# Patient Record
Sex: Female | Born: 1995 | Race: Black or African American | Hispanic: No | Marital: Single | Smoking: Former smoker
Health system: Southern US, Community
[De-identification: ages and names within clinical notes are randomized; demographics above are authoritative.]

## PROBLEM LIST (undated history)

## (undated) DIAGNOSIS — Z98891 History of uterine scar from previous surgery: Secondary | ICD-10-CM

## (undated) DIAGNOSIS — E669 Obesity, unspecified: Secondary | ICD-10-CM

## (undated) DIAGNOSIS — N390 Urinary tract infection, site not specified: Secondary | ICD-10-CM

## (undated) HISTORY — PX: CHOLECYSTECTOMY: SHX55

## (undated) HISTORY — PX: LAPAROSCOPIC GASTRIC RESTRICTIVE DUODENAL PROCEDURE (DUODENAL SWITCH): SHX6667

---

## 2014-06-09 ENCOUNTER — Ambulatory Visit (INDEPENDENT_AMBULATORY_CARE_PROVIDER_SITE_OTHER): Payer: BLUE CROSS/BLUE SHIELD | Admitting: Family Medicine

## 2014-06-09 VITALS — BP 118/78 | HR 72 | Temp 98.2°F | Resp 16 | Ht 71.0 in | Wt 311.8 lb

## 2014-06-09 DIAGNOSIS — Z2821 Immunization not carried out because of patient refusal: Secondary | ICD-10-CM

## 2014-06-09 DIAGNOSIS — Z0000 Encounter for general adult medical examination without abnormal findings: Secondary | ICD-10-CM | POA: Diagnosis not present

## 2014-06-09 NOTE — Progress Notes (Signed)
      Chief Complaint:  Chief Complaint  Patient presents with  . Annual Exam    school    HPI: Wendy Mcfarland is a 19 y.o. female who is here for a school CPE For a physical [facility]  Is up-to-date on all her vaccines. She does not have her vaccine records here with us. She has had Her TB placement somewhere and does not have TB. Declined any blood work today. Menarche, reg, every  30 days Sexually active, no STDs, has been tested G0        History reviewed. No pertinent past medical history. History reviewed. No pertinent past surgical history. History   Social History  . Marital Status: Single    Spouse Name: N/A  . Number of Children: N/A  . Years of Education: N/A   Social History Main Topics  . Smoking status: Never Smoker   . Smokeless tobacco: Not on file  . Alcohol Use: Not on file  . Drug Use: Not on file  . Sexual Activity: Not on file   Other Topics Concern  . None   Social History Narrative  . None   History reviewed. No pertinent family history. No Known Allergies Prior to Admission medications   Not on File     ROS: The patient denies fevers, chills, night sweats, unintentional weight loss, chest pain, palpitations, wheezing, dyspnea on exertion, nausea, vomiting, abdominal pain, dysuria, hematuria, melena, numbness, weakness, or tingling.   All other systems have been reviewed and were otherwise negative with the exception of those mentioned in the HPI and as above.    PHYSICAL EXAM: Filed Vitals:   06/09/14 1332  BP: 118/78  Pulse: 72  Temp: 98.2 F (36.8 C)  Resp: 16   Filed Vitals:   06/09/14 1332  Height: 5\' 11"  (1.803 m)  Weight: 311 lb 12.8 oz (141.432 kg)   Body mass index is 43.51 kg/(m^2).  General: Alert, no acute distress, tall morbidly female HEENT:  Normocephalic, atraumatic, oropharynx patent. EOMI, PERRLA, funduscopic exam normal, and panic membrane  normal Cardiovascular:  Regular rate and rhythm, no rubs murmurs or gallops.  No Carotid bruits, radial pulse intact. No pedal edema.  Respiratory: Clear to auscultation bilaterally.  No wheezes, rales, or rhonchi.  No cyanosis, no use of accessory musculature GI: No organomegaly, abdomen is soft and non-tender, positive bowel sounds.  No masses. Skin: No rashes. Neurologic: Facial musculature symmetric. Psychiatric: Patient is appropriate throughout our interaction. Lymphatic: No cervical lymphadenopathy Musculoskeletal: Gait intact. 5 out of 5 strength, 2 out of 2 DTRs, negative for scoliosis   LABS: No results found for this or any previous visit.   EKG/XRAY:   Primary [facility]   ASSESSMENT/PLAN: Encounter Diagnoses  Name Primary?  . Annual physical exam Yes  . Influenza vaccination declined    Pleasant 19 year old female who declines any blood work for her annual visit States she has  had all her vaccines. Except for influenza vaccine which she does not want. Follow-up as needed.  Gross sideeffects, risk and benefits, and alternatives of medications d/w patient. Patient is aware that all medications have potential sideeffects and we are unable to predict every sideeffect or drug-drug interaction that may occur.  Hamilton CapriLE, Zitlaly Malson PHUONG, DO 06/09/2014 2:29 PM

## 2014-06-11 ENCOUNTER — Encounter: Payer: Self-pay | Admitting: Family Medicine

## 2017-11-16 LAB — OB RESULTS CONSOLE RUBELLA ANTIBODY, IGM: Rubella: IMMUNE

## 2017-11-16 LAB — OB RESULTS CONSOLE ABO/RH: RH Type: POSITIVE

## 2017-11-16 LAB — OB RESULTS CONSOLE GC/CHLAMYDIA
Chlamydia: NEGATIVE
Gonorrhea: NEGATIVE

## 2017-11-16 LAB — OB RESULTS CONSOLE HEPATITIS B SURFACE ANTIGEN: Hepatitis B Surface Ag: NEGATIVE

## 2017-11-16 LAB — OB RESULTS CONSOLE HIV ANTIBODY (ROUTINE TESTING): HIV: NONREACTIVE

## 2017-11-30 ENCOUNTER — Encounter (HOSPITAL_COMMUNITY): Payer: Self-pay

## 2017-12-31 ENCOUNTER — Ambulatory Visit: Payer: BLUE CROSS/BLUE SHIELD | Admitting: Podiatry

## 2018-01-16 ENCOUNTER — Encounter (HOSPITAL_COMMUNITY): Payer: Self-pay

## 2018-02-06 ENCOUNTER — Other Ambulatory Visit (HOSPITAL_COMMUNITY): Payer: Self-pay | Admitting: Obstetrics and Gynecology

## 2018-02-06 DIAGNOSIS — Z363 Encounter for antenatal screening for malformations: Secondary | ICD-10-CM

## 2018-02-13 ENCOUNTER — Encounter (HOSPITAL_COMMUNITY): Payer: Self-pay

## 2018-02-15 ENCOUNTER — Encounter (HOSPITAL_COMMUNITY): Payer: Self-pay | Admitting: *Deleted

## 2018-02-21 ENCOUNTER — Ambulatory Visit (HOSPITAL_COMMUNITY)
Admission: RE | Admit: 2018-02-21 | Discharge: 2018-02-21 | Disposition: A | Discharge status: Home / Self Care | Payer: BLUE CROSS/BLUE SHIELD | Source: Ambulatory Visit | Attending: Obstetrics and Gynecology | Admitting: Obstetrics and Gynecology

## 2018-02-21 ENCOUNTER — Encounter (HOSPITAL_COMMUNITY): Payer: Self-pay

## 2018-02-21 DIAGNOSIS — O30042 Twin pregnancy, dichorionic/diamniotic, second trimester: Secondary | ICD-10-CM | POA: Diagnosis present

## 2018-02-21 DIAGNOSIS — Z363 Encounter for antenatal screening for malformations: Secondary | ICD-10-CM

## 2018-02-21 DIAGNOSIS — E669 Obesity, unspecified: Secondary | ICD-10-CM | POA: Diagnosis not present

## 2018-02-21 DIAGNOSIS — Z3A24 24 weeks gestation of pregnancy: Secondary | ICD-10-CM | POA: Insufficient documentation

## 2018-02-21 DIAGNOSIS — O99212 Obesity complicating pregnancy, second trimester: Secondary | ICD-10-CM | POA: Diagnosis not present

## 2018-02-21 DIAGNOSIS — O283 Abnormal ultrasonic finding on antenatal screening of mother: Secondary | ICD-10-CM | POA: Insufficient documentation

## 2018-02-21 HISTORY — DX: Obesity, unspecified: E66.9

## 2018-02-21 HISTORY — DX: Urinary tract infection, site not specified: N39.0

## 2018-03-29 ENCOUNTER — Other Ambulatory Visit: Payer: Self-pay

## 2018-03-29 ENCOUNTER — Inpatient Hospital Stay (HOSPITAL_BASED_OUTPATIENT_CLINIC_OR_DEPARTMENT_OTHER): Payer: BLUE CROSS/BLUE SHIELD

## 2018-03-29 ENCOUNTER — Inpatient Hospital Stay (HOSPITAL_COMMUNITY)
Admission: AD | Admit: 2018-03-29 | Discharge: 2018-04-02 | DRG: 786 | Disposition: A | Discharge status: Home / Self Care | Payer: BLUE CROSS/BLUE SHIELD | Attending: Obstetrics and Gynecology | Admitting: Obstetrics and Gynecology

## 2018-03-29 ENCOUNTER — Encounter (HOSPITAL_COMMUNITY): Payer: Self-pay | Admitting: *Deleted

## 2018-03-29 DIAGNOSIS — O42913 Preterm premature rupture of membranes, unspecified as to length of time between rupture and onset of labor, third trimester: Principal | ICD-10-CM | POA: Diagnosis present

## 2018-03-29 DIAGNOSIS — O99212 Obesity complicating pregnancy, second trimester: Secondary | ICD-10-CM

## 2018-03-29 DIAGNOSIS — O42912 Preterm premature rupture of membranes, unspecified as to length of time between rupture and onset of labor, second trimester: Secondary | ICD-10-CM | POA: Diagnosis not present

## 2018-03-29 DIAGNOSIS — O30042 Twin pregnancy, dichorionic/diamniotic, second trimester: Secondary | ICD-10-CM | POA: Diagnosis not present

## 2018-03-29 DIAGNOSIS — O4292 Full-term premature rupture of membranes, unspecified as to length of time between rupture and onset of labor: Secondary | ICD-10-CM | POA: Diagnosis not present

## 2018-03-29 DIAGNOSIS — O42919 Preterm premature rupture of membranes, unspecified as to length of time between rupture and onset of labor, unspecified trimester: Secondary | ICD-10-CM | POA: Diagnosis present

## 2018-03-29 DIAGNOSIS — Z87891 Personal history of nicotine dependence: Secondary | ICD-10-CM | POA: Diagnosis not present

## 2018-03-29 DIAGNOSIS — Z362 Encounter for other antenatal screening follow-up: Secondary | ICD-10-CM

## 2018-03-29 DIAGNOSIS — O9902 Anemia complicating childbirth: Secondary | ICD-10-CM | POA: Diagnosis present

## 2018-03-29 DIAGNOSIS — O328XX1 Maternal care for other malpresentation of fetus, fetus 1: Secondary | ICD-10-CM | POA: Diagnosis present

## 2018-03-29 DIAGNOSIS — Z3A29 29 weeks gestation of pregnancy: Secondary | ICD-10-CM

## 2018-03-29 DIAGNOSIS — O30043 Twin pregnancy, dichorionic/diamniotic, third trimester: Secondary | ICD-10-CM | POA: Diagnosis present

## 2018-03-29 DIAGNOSIS — D649 Anemia, unspecified: Secondary | ICD-10-CM | POA: Diagnosis present

## 2018-03-29 DIAGNOSIS — Z98891 History of uterine scar from previous surgery: Secondary | ICD-10-CM

## 2018-03-29 HISTORY — DX: History of uterine scar from previous surgery: Z98.891

## 2018-03-29 LAB — TYPE AND SCREEN
ABO/RH(D): A POS
Antibody Screen: NEGATIVE

## 2018-03-29 LAB — URINALYSIS, ROUTINE W REFLEX MICROSCOPIC
Bacteria, UA: NONE SEEN
Bilirubin Urine: NEGATIVE
Glucose, UA: NEGATIVE mg/dL
Hgb urine dipstick: NEGATIVE
Ketones, ur: NEGATIVE mg/dL
Nitrite: NEGATIVE
Protein, ur: NEGATIVE mg/dL
Specific Gravity, Urine: 1.023 (ref 1.005–1.030)
pH: 5 (ref 5.0–8.0)

## 2018-03-29 LAB — AMNISURE RUPTURE OF MEMBRANE (ROM) NOT AT ARMC: Amnisure ROM: POSITIVE

## 2018-03-29 LAB — POCT FERN TEST: POCT Fern Test: POSITIVE

## 2018-03-29 LAB — ABO/RH: ABO/RH(D): A POS

## 2018-03-29 MED ORDER — SODIUM CHLORIDE 0.9 % IV SOLN
500.0000 mg | INTRAVENOUS | Status: AC
  Administered 2018-03-29 – 2018-03-30 (×2): 500 mg via INTRAVENOUS
  Filled 2018-03-29 (×2): qty 500

## 2018-03-29 MED ORDER — ZOLPIDEM TARTRATE 5 MG PO TABS
5.0000 mg | ORAL_TABLET | Freq: Every evening | ORAL | Status: DC | PRN
  Administered 2018-03-29: 5 mg via ORAL
  Filled 2018-03-29: qty 1

## 2018-03-29 MED ORDER — CLINDAMYCIN PHOSPHATE 900 MG/50ML IV SOLN
900.0000 mg | Freq: Three times a day (TID) | INTRAVENOUS | Status: DC
  Filled 2018-03-29 (×2): qty 50

## 2018-03-29 MED ORDER — BETAMETHASONE SOD PHOS & ACET 6 (3-3) MG/ML IJ SUSP
12.0000 mg | INTRAMUSCULAR | Status: AC
  Administered 2018-03-29 – 2018-03-30 (×2): 12 mg via INTRAMUSCULAR
  Filled 2018-03-29 (×2): qty 2

## 2018-03-29 MED ORDER — ACETAMINOPHEN 325 MG PO TABS
650.0000 mg | ORAL_TABLET | ORAL | Status: DC | PRN
  Administered 2018-03-30: 650 mg via ORAL
  Filled 2018-03-29: qty 2

## 2018-03-29 MED ORDER — CALCIUM CARBONATE ANTACID 500 MG PO CHEW: 2.0000 | CHEWABLE_TABLET | ORAL | Status: DC | PRN

## 2018-03-29 MED ORDER — DOCUSATE SODIUM 100 MG PO CAPS
100.0000 mg | ORAL_CAPSULE | Freq: Every day | ORAL | Status: DC
  Filled 2018-03-29: qty 1

## 2018-03-29 MED ORDER — AZITHROMYCIN 250 MG PO TABS: 500.0000 mg | ORAL_TABLET | Freq: Every day | ORAL | Status: DC

## 2018-03-29 MED ORDER — CEFAZOLIN SODIUM-DEXTROSE 2-4 GM/100ML-% IV SOLN
2.0000 g | Freq: Once | INTRAVENOUS | Status: AC
  Administered 2018-03-29: 2 g via INTRAVENOUS
  Filled 2018-03-29: qty 100

## 2018-03-29 MED ORDER — PRENATAL MULTIVITAMIN CH: 1.0000 | ORAL_TABLET | Freq: Every day | ORAL | Status: DC

## 2018-03-29 MED ORDER — CEFAZOLIN SODIUM-DEXTROSE 1-4 GM/50ML-% IV SOLN
1.0000 g | Freq: Four times a day (QID) | INTRAVENOUS | Status: DC
  Administered 2018-03-29 – 2018-03-30 (×4): 1 g via INTRAVENOUS
  Filled 2018-03-29 (×5): qty 50

## 2018-03-29 MED ORDER — LACTATED RINGERS IV SOLN
INTRAVENOUS | Status: DC
  Administered 2018-03-30: 01:00:00 via INTRAVENOUS

## 2018-03-29 NOTE — Progress Notes (Signed)
Pharmacy Antibiotic Note  Wendy Mcfarland is a 23 y.o. G1P0 at [redacted]w[redacted]d admitted on 03/29/2018 with PPROM. Pt has a history of penicillin allergy. Pharmacy has been consulted for cefazolin dosing.  Plan: Cefazolin 2 Gm IV x 1 dose; then 1 Gm IV every 6 hours for 7 days  Height: 5\' 11"  (180.3 cm) Weight: 275 lb 2.2 oz (124.8 kg) IBW/kg (Calculated) : 70.8  Temp (24hrs), Avg:98.7 F (37.1 C), Min:98.6 F (37 C), Max:98.8 F (37.1 C)  No results for input(s): WBC, CREATININE, LATICACIDVEN, VANCOTROUGH, VANCOPEAK, VANCORANDOM, GENTTROUGH, GENTPEAK, GENTRANDOM, TOBRATROUGH, TOBRAPEAK, TOBRARND, AMIKACINPEAK, AMIKACINTROU, AMIKACIN in the last 168 hours.  CrCl cannot be calculated (No successful lab value found.).    Allergies  Allergen Reactions  . Penicillins Hives    Antimicrobials this admission: Azithromycin 03/29/17>> for 7 days  Dose adjustments this admission: N/A  Microbiology results:  Thank you for allowing pharmacy to be a part of this patient's care.  Arelia Sneddon 03/29/2018 7:03 AM

## 2018-03-29 NOTE — MAU Provider Note (Signed)
History     CSN: [id number]  Arrival date and time: 03/29/18 [id number]   First Provider Initiated Contact with Patient 03/29/18 [phone]      Chief Complaint  Patient presents with  . Rupture of Membranes   Wendy Mcfarland is a 23 y.o. G1P0 at 6973w6d who presents today with leaking fluid. She states that at 0230 she woke up in a puddle of fluid, and she has been leaking since. She denies any VB or contractions. She reports normal fetal movement. She denies any complications with this pregnancy. She denies any hx of CHTN. At her first OB visit her B/P was 138/90. She states that was when she found out she was having twins, and she believes it was elevated because of that. Next OB visit: 04/05/18  Vaginal Discharge  The patient's primary symptoms include vaginal discharge. The patient's pertinent negatives include no pelvic pain. This is a new problem. The current episode started today. The problem occurs constantly. The problem has been unchanged. The patient is experiencing no pain. She is pregnant. Pertinent negatives include no chills, dysuria, fever, frequency, nausea, urgency or vomiting. The vaginal discharge was watery. There has been no bleeding. Nothing aggravates the symptoms. She has tried nothing for the symptoms. Sexual activity: denies any intercourse in the last 24 hours.     OB History    Gravida  1   Para      Term      Preterm      AB      Living  0     SAB      TAB      Ectopic      Multiple      Live Births              Past Medical History:  Diagnosis Date  . Obesity   . Urinary tract infection     Past Surgical History:  Procedure Laterality Date  . 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  Social History   Tobacco Use  . Smoking status: Former Smoker    Types: Cigarettes    Last attempt to quit: 07/22/2017    Years since quitting: 0.6  . Smokeless tobacco: Never Used  Substance Use  Topics  . Alcohol use: Not Currently    Alcohol/week: 0.0 standard drinks  . Drug use: Never    Allergies:  Allergies  Allergen Reactions  . Penicillins Hives    Medications Prior to Admission  Medication Sig Dispense Refill Last Dose  . diphenhydrAMINE HCl (BENADRYL PO) Take by mouth.   Past Month at Unknown time  . Prenatal Multivit-Min-Fe-FA (PRENATAL VITAMINS PO) Take by mouth.   03/28/2018 at Unknown time    Review of Systems  Constitutional: Negative for chills and fever.  Gastrointestinal: Negative for nausea and vomiting.  Genitourinary: Positive for vaginal discharge. Negative for dysuria, frequency, pelvic pain and urgency.   Physical Exam   Blood pressure 128/80, pulse 76, temperature 98.6 F (37 C), temperature source Oral, resp. rate 19, height 5\' 11"  (1.803 m), weight 124.8 kg, last menstrual period 09/05/2017.  Physical Exam  Nursing note and vitals reviewed. Constitutional: She is oriented to person, place, and time. She appears well-developed and well-nourished. No distress.  HENT:  Head: Normocephalic.  Cardiovascular: Normal rate.  Respiratory: Effort normal.  GI: Soft. There is no abdominal tenderness. There is no rebound.  Genitourinary:    Genitourinary Comments: Patient  grossly ruptured with clear fluid covering perineum.  Full pelvic exam deferred due to patient not feeling any contractions and fluid visible on the perineum.    Neurological: She is alert and oriented to person, place, and time.  Skin: Skin is warm and dry.  Psychiatric: She has a normal mood and affect.     NST: A Baseline: 140 Variability: moderate Accels: 10x10 Decels: none Toco: irregular  NST: B Baseline: 145 Variability: moderate Accels: 10x10 Decels: none Toco: irregular  Results for orders placed or performed during the hospital encounter of 03/29/18 (from the past 24 hour(s))  Amnisure rupture of membrane (rom)not at [facility]     Status: None   Collection Time:  03/29/18  4:40 AM  Result Value Ref Range   Amnisure ROM POSITIVE   Urinalysis, Routine w reflex microscopic     Status: Abnormal   Collection Time: 03/29/18  4:51 AM  Result Value Ref Range   Color, Urine YELLOW YELLOW   APPearance HAZY (A) CLEAR   Specific Gravity, Urine 1.023 1.005 - 1.030   pH 5.0 5.0 - 8.0   Glucose, UA NEGATIVE NEGATIVE mg/dL   Hgb urine dipstick NEGATIVE NEGATIVE   Bilirubin Urine NEGATIVE NEGATIVE   Ketones, ur NEGATIVE NEGATIVE mg/dL   Protein, ur NEGATIVE NEGATIVE mg/dL   Nitrite NEGATIVE NEGATIVE   Leukocytes, UA TRACE (A) NEGATIVE   RBC / HPF 0-5 0 - 5 RBC/hpf   WBC, UA 11-20 0 - 5 WBC/hpf   Bacteria, UA NONE SEEN NONE SEEN   Squamous Epithelial / LPF 6-10 0 - 5   Mucus PRESENT    Ca Oxalate Crys, UA PRESENT   Fern Test     Status: Abnormal   Collection Time: 03/29/18  4:53 AM  Result Value Ref Range   POCT Fern Test Positive = ruptured amniotic membanes      MAU Course  Procedures  MDM  5:12 AM Consult with Dr. Jackelyn Knife, will admit to 3rd floor. Latency abx, BMZ, NICU consult and MFM FU [place] ordered.   Patient reports developing hives when taking PCN on 2 different occasions as a child. No airway edema or airway involvement.   Assessment and Plan   1. Preterm premature rupture of membranes (PPROM) with unknown onset of labor    Admit to 3rd floor Routine orders  Thressa Sheller DNP, CNM  03/29/18  5:11 AM

## 2018-03-29 NOTE — Consult Note (Signed)
Neonatology Consult to Antenatal Patient:  I was asked by Dr. Hinton Rao to see this patient in order to provide antenatal counseling due to SROM at 29 6/7 weeks.  Wendy Mcfarland was admitted today at 62 6/[redacted] weeks GA. Pregnancy has been complicated by di-di concordant twins. She had gross ROM early this morning. She is currently not having active labor. She is getting BMZ and IV Azithromycin and Ancef. These are her first babies, 2 boys.  I spoke with the patient and the father of the babies. We discussed the worst case of delivery in the next 1-2 days, including usual DR management, possible respiratory complications and need for support, IV access, feedings (mother desires breast feeding, which was encouraged), LOS, Mortality and Morbidity, and long term outcomes. She did not have any questions at this time. I offered a NICU tour to any interested family members and would be glad to come back if she has more questions later.  Thank you for asking me to see this patient.  Doretha Sou, MD Neonatologist  The total length of face-to-face or floor/unit time for this encounter was 25 minutes. Counseling and/or coordination of care was 15 minutes of the above.

## 2018-03-29 NOTE — MAU Note (Signed)
Pt presents to MAU with possible PROM @0230  clear fluid. Pt reports one small blood clot and +FM.

## 2018-03-29 NOTE — H&P (Signed)
Wendy Mcfarland is a 23 y.o. female G1 at 29+6 with PPROM, confirmed in MAU.  Di/Di twin gestation.  ROM at 2-3am, copious clear fluid.  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  Glucola WNL Varicella nonimmune.  OB History    Gravida  1   Para      Term      Preterm      AB      Living  0     SAB      TAB      Ectopic      Multiple      Live Births            G1 present  No abn pap No STD  Past Medical History:  Diagnosis Date  . Obesity   . Urinary tract infection    Past Surgical History:  Procedure Laterality Date  . 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 Social History:  reports that she quit smoking about 8 months ago. Her smoking use included cigarettes. She has never used smokeless tobacco. She reports previous alcohol use. She reports that she does not use drugs.works in childcare, single  Meds PNV All NKDA     Maternal Diabetes: No Genetic Screening: Normal Maternal Ultrasounds/Referrals: Abnormal:  Findings:   Isolated EIF (echogenic intracardiac focus) Fetal Ultrasounds or other Referrals:  Referred to Materal Fetal Medicine  Maternal Substance Abuse:  No Significant Maternal Medications:  None Significant Maternal Lab Results:  None Other Comments:  None  Review of Systems  Constitutional: Negative.   HENT: Negative.   Eyes: Negative.   Respiratory: Negative.   Cardiovascular: Negative.   Gastrointestinal: Negative.   Genitourinary: Negative.   Musculoskeletal: Negative.   Skin: Negative.   Neurological: Negative.   Psychiatric/Behavioral: Negative.    Maternal Medical History:  Reason for admission: Rupture of membranes.   Contractions: Frequency: irregular.    Fetal activity: Perceived fetal activity is normal.    Prenatal complications: Twin pregnancy, PPROM  Prenatal  Complications - Diabetes: none.      Blood pressure 122/83, pulse 79, temperature 98.9 F (37.2 C), temperature source Oral, resp. rate 18, height 5\' 11"  (1.803 m), weight 124.8 kg, last menstrual period 09/05/2017, SpO2 99 %. Maternal Exam:  Uterine Assessment: Contraction strength is moderate.  Contraction frequency is irregular.   Abdomen: Patient reports no abdominal tenderness. Fundal height is appropriate for gestation.   Estimated fetal weight is 2-3#, 2-3#.    Introitus: Normal vulva. Normal vagina.    Physical Exam  Constitutional: She is oriented to person, place, and time. She appears well-developed and well-nourished.  HENT:  Head: Normocephalic and atraumatic.  Cardiovascular: Normal rate and regular rhythm.  Respiratory: Effort normal and breath sounds normal. No respiratory distress. She has no wheezes.  GI: Soft. Bowel sounds are normal. She exhibits no distension. There is no abdominal tenderness.  Genitourinary:    Vulva normal.   Musculoskeletal: Normal range of motion.  Neurological: She is alert and oriented to person, place, and time.  Skin: Skin is warm and dry.  Psychiatric: She has a normal mood and affect. Her behavior is normal.    Prenatal labs: ABO, Rh: --/--/A POS (01/08 0440) Antibody: NEG (01/08 0440) Rubella:  immune RPR:   NR HBsAg:   neg HIV:   neg GBS:   unk  Hgb 11.9/Plt 186/Ur Cx + ecoli -  TOC neg/GC neg/Chl neg/Varicella nonimmune Hgb electro WNL WNL/ glucola 83  Essential panel - neg  Nl anat, di/di, ant plac x 2 - EIF  MFM F/u WNL MFM  Assessment/Plan: PPROM at 29+6  Offered Flu, Tdap - declined Will collect GC/Chl/GBBS Varicella vaccine PP Ancef/Zithro for latency BMZ for fetal lung maturity  Close monitoring [place] - breech presentation twin A - d/w pt delivery by LTCS - d/w pt r/b/a.  Questions answered.  If labors will proceed,    Wendy Mcfarland 03/29/2018, 1:09 PM

## 2018-03-29 NOTE — Progress Notes (Signed)
Patient ID: Wendy Mcfarland, female   DOB: 10-Jan-1996, 23 y.o.   MRN: [id number]\\  29+6, di/di twins, PPRIOM  +FM, cont LOF, clear; no VB, no ctx  AFVSS gen NAD FHTs - 130-140's, mod var, 10x10 accels - app for gestation, category 1 140's, mod var, 10X10 accels, app fpr gestation; category 1 toco none  Abd gravid, NT  Abx for latency BMZ for lung maturity NICU c/s - MD called MFM [place] today

## 2018-03-30 ENCOUNTER — Encounter (HOSPITAL_COMMUNITY)
Admission: AD | Disposition: A | Discharge status: Home / Self Care | Payer: Self-pay | Source: Home / Self Care | Attending: Obstetrics and Gynecology

## 2018-03-30 ENCOUNTER — Encounter (HOSPITAL_COMMUNITY): Payer: Self-pay | Admitting: Obstetrics and Gynecology

## 2018-03-30 ENCOUNTER — Inpatient Hospital Stay (HOSPITAL_COMMUNITY): Payer: BLUE CROSS/BLUE SHIELD | Admitting: Anesthesiology

## 2018-03-30 DIAGNOSIS — Z98891 History of uterine scar from previous surgery: Secondary | ICD-10-CM

## 2018-03-30 HISTORY — DX: History of uterine scar from previous surgery: Z98.891

## 2018-03-30 LAB — COMPREHENSIVE METABOLIC PANEL
ALT: 13 U/L (ref 0–44)
AST: 15 U/L (ref 15–41)
Albumin: 2.7 g/dL — ABNORMAL LOW (ref 3.5–5.0)
Alkaline Phosphatase: 70 U/L (ref 38–126)
Anion gap: 9 (ref 5–15)
BUN: 9 mg/dL (ref 6–20)
CO2: 17 mmol/L — ABNORMAL LOW (ref 22–32)
Calcium: 8.4 mg/dL — ABNORMAL LOW (ref 8.9–10.3)
Chloride: 108 mmol/L (ref 98–111)
Creatinine, Ser: 0.54 mg/dL (ref 0.44–1.00)
GFR calc Af Amer: >60 mL/min (ref >60)
GFR calc non Af Amer: >60 mL/min (ref >60)
Glucose, Bld: 119 mg/dL — ABNORMAL HIGH (ref 70–99)
Potassium: 3.2 mmol/L — ABNORMAL LOW (ref 3.5–5.1)
Sodium: 134 mmol/L — ABNORMAL LOW (ref 135–145)
Total Bilirubin: 0.5 mg/dL (ref 0.3–1.2)
Total Protein: 5.8 g/dL — ABNORMAL LOW (ref 6.5–8.1)

## 2018-03-30 LAB — CBC
HCT: 28 % — ABNORMAL LOW (ref 36.0–46.0)
Hemoglobin: 9.4 g/dL — ABNORMAL LOW (ref 12.0–15.0)
MCH: 29.3 pg (ref 26.0–34.0)
MCHC: 33.6 g/dL (ref 30.0–36.0)
MCV: 87.2 fL (ref 80.0–100.0)
Platelets: 143 10*3/uL — ABNORMAL LOW (ref 150–400)
RBC: 3.21 MIL/uL — ABNORMAL LOW (ref 3.87–5.11)
RDW: 12.9 % (ref 11.5–15.5)
WBC: 10 10*3/uL (ref 4.0–10.5)
nRBC: 0 % (ref 0.0–0.2)

## 2018-03-30 LAB — GC/CHLAMYDIA PROBE AMP (~~LOC~~) NOT AT ARMC
Chlamydia: NEGATIVE
Neisseria Gonorrhea: NEGATIVE

## 2018-03-30 SURGERY — Surgical Case
Anesthesia: General

## 2018-03-30 MED ORDER — LIDOCAINE HCL (CARDIAC) PF 100 MG/5ML IV SOSY
PREFILLED_SYRINGE | INTRAVENOUS | Status: DC | PRN
  Administered 2018-03-30: 100 mg via INTRAVENOUS

## 2018-03-30 MED ORDER — ONDANSETRON HCL 4 MG/2ML IJ SOLN: 4.0000 mg | Freq: Four times a day (QID) | INTRAMUSCULAR | Status: DC | PRN

## 2018-03-30 MED ORDER — DIPHENHYDRAMINE HCL 50 MG/ML IJ SOLN: 12.5000 mg | Freq: Four times a day (QID) | INTRAMUSCULAR | Status: DC | PRN

## 2018-03-30 MED ORDER — SUCCINYLCHOLINE CHLORIDE 20 MG/ML IJ SOLN
INTRAMUSCULAR | Status: DC | PRN
  Administered 2018-03-30: 100 mg via INTRAVENOUS

## 2018-03-30 MED ORDER — FENTANYL CITRATE (PF) 250 MCG/5ML IJ SOLN
INTRAMUSCULAR | Status: AC
  Filled 2018-03-30: qty 5

## 2018-03-30 MED ORDER — COCONUT OIL OIL: 1.0000 "application " | TOPICAL_OIL | Status: DC | PRN

## 2018-03-30 MED ORDER — DIPHENHYDRAMINE HCL 12.5 MG/5ML PO ELIX
12.5000 mg | ORAL_SOLUTION | Freq: Four times a day (QID) | ORAL | Status: DC | PRN
  Filled 2018-03-30: qty 5

## 2018-03-30 MED ORDER — SODIUM CHLORIDE 0.9% FLUSH: 3.0000 mL | INTRAVENOUS | Status: DC | PRN

## 2018-03-30 MED ORDER — FENTANYL CITRATE (PF) 100 MCG/2ML IJ SOLN
INTRAMUSCULAR | Status: AC
  Filled 2018-03-30: qty 2

## 2018-03-30 MED ORDER — WITCH HAZEL-GLYCERIN EX PADS: 1.0000 "application " | MEDICATED_PAD | CUTANEOUS | Status: DC | PRN

## 2018-03-30 MED ORDER — FENTANYL CITRATE (PF) 100 MCG/2ML IJ SOLN
INTRAMUSCULAR | Status: DC | PRN
  Administered 2018-03-30 (×2): 50 ug via INTRAVENOUS
  Administered 2018-03-30: 250 ug via INTRAVENOUS
  Administered 2018-03-30: 100 ug via INTRAVENOUS

## 2018-03-30 MED ORDER — EPHEDRINE 5 MG/ML INJ
INTRAVENOUS | Status: AC
  Filled 2018-03-30: qty 10

## 2018-03-30 MED ORDER — LACTATED RINGERS IV SOLN
INTRAVENOUS | Status: DC
  Administered 2018-03-30: 12:00:00 via INTRAVENOUS

## 2018-03-30 MED ORDER — SUGAMMADEX SODIUM 500 MG/5ML IV SOLN
INTRAVENOUS | Status: AC
  Filled 2018-03-30: qty 5

## 2018-03-30 MED ORDER — TETANUS-DIPHTH-ACELL PERTUSSIS 5-2.5-18.5 LF-MCG/0.5 IM SUSP: 0.5000 mL | Freq: Once | INTRAMUSCULAR | Status: DC

## 2018-03-30 MED ORDER — KETOROLAC TROMETHAMINE 30 MG/ML IJ SOLN
INTRAMUSCULAR | Status: AC
  Filled 2018-03-30: qty 1

## 2018-03-30 MED ORDER — MENTHOL 3 MG MT LOZG: 1.0000 | LOZENGE | OROMUCOSAL | Status: DC | PRN

## 2018-03-30 MED ORDER — OXYTOCIN 40 UNITS IN NORMAL SALINE INFUSION - SIMPLE MED: 2.5000 [IU]/h | INTRAVENOUS | Status: AC

## 2018-03-30 MED ORDER — OXYTOCIN 10 UNIT/ML IJ SOLN
INTRAVENOUS | Status: DC | PRN
  Administered 2018-03-30: 40 [IU] via INTRAVENOUS

## 2018-03-30 MED ORDER — ONDANSETRON HCL 4 MG/2ML IJ SOLN
INTRAMUSCULAR | Status: DC | PRN
  Administered 2018-03-30: 4 mg via INTRAVENOUS

## 2018-03-30 MED ORDER — DIPHENHYDRAMINE HCL 12.5 MG/5ML PO ELIX: 12.5000 mg | ORAL_SOLUTION | Freq: Four times a day (QID) | ORAL | Status: DC | PRN

## 2018-03-30 MED ORDER — NALBUPHINE HCL 10 MG/ML IJ SOLN: 5.0000 mg | Freq: Once | INTRAMUSCULAR | Status: DC | PRN

## 2018-03-30 MED ORDER — LACTATED RINGERS IV SOLN
INTRAVENOUS | Status: DC | PRN
  Administered 2018-03-30: 08:00:00 via INTRAVENOUS

## 2018-03-30 MED ORDER — NALOXONE HCL 4 MG/10ML IJ SOLN
1.0000 ug/kg/h | INTRAVENOUS | Status: DC | PRN
  Filled 2018-03-30: qty 5

## 2018-03-30 MED ORDER — SENNOSIDES-DOCUSATE SODIUM 8.6-50 MG PO TABS
2.0000 | ORAL_TABLET | ORAL | Status: DC
  Administered 2018-03-30 – 2018-03-31 (×2): 2 via ORAL
  Filled 2018-03-30 (×2): qty 2

## 2018-03-30 MED ORDER — NALBUPHINE HCL 10 MG/ML IJ SOLN: 5.0000 mg | INTRAMUSCULAR | Status: DC | PRN

## 2018-03-30 MED ORDER — SODIUM CHLORIDE 0.9% FLUSH: 9.0000 mL | INTRAVENOUS | Status: DC | PRN

## 2018-03-30 MED ORDER — MIDAZOLAM HCL 2 MG/2ML IJ SOLN
INTRAMUSCULAR | Status: AC
  Filled 2018-03-30: qty 2

## 2018-03-30 MED ORDER — MEPERIDINE HCL 25 MG/ML IJ SOLN: 6.2500 mg | INTRAMUSCULAR | Status: DC | PRN

## 2018-03-30 MED ORDER — ZOLPIDEM TARTRATE 5 MG PO TABS: 5.0000 mg | ORAL_TABLET | Freq: Every evening | ORAL | Status: DC | PRN

## 2018-03-30 MED ORDER — EPHEDRINE SULFATE 50 MG/ML IJ SOLN
INTRAMUSCULAR | Status: DC | PRN
  Administered 2018-03-30: 10 mg via INTRAVENOUS

## 2018-03-30 MED ORDER — MIDAZOLAM HCL 2 MG/2ML IJ SOLN
INTRAMUSCULAR | Status: DC | PRN
  Administered 2018-03-30: 2 mg via INTRAVENOUS

## 2018-03-30 MED ORDER — PROMETHAZINE HCL 25 MG/ML IJ SOLN: 6.2500 mg | INTRAMUSCULAR | Status: DC | PRN

## 2018-03-30 MED ORDER — HYDROMORPHONE 1 MG/ML IV SOLN
INTRAVENOUS | Status: DC
  Administered 2018-03-30: 30 mg via INTRAVENOUS
  Administered 2018-03-30: 0.6 mg via INTRAVENOUS
  Administered 2018-03-30: 1.4 mg via INTRAVENOUS
  Administered 2018-03-30: 0.6 mg via INTRAVENOUS
  Filled 2018-03-30: qty 25

## 2018-03-30 MED ORDER — PHENYLEPHRINE 40 MCG/ML (10ML) SYRINGE FOR IV PUSH (FOR BLOOD PRESSURE SUPPORT)
PREFILLED_SYRINGE | INTRAVENOUS | Status: AC
  Filled 2018-03-30: qty 10

## 2018-03-30 MED ORDER — PROPOFOL 10 MG/ML IV BOLUS
INTRAVENOUS | Status: AC
  Filled 2018-03-30: qty 20

## 2018-03-30 MED ORDER — SIMETHICONE 80 MG PO CHEW
80.0000 mg | CHEWABLE_TABLET | Freq: Three times a day (TID) | ORAL | Status: DC
  Administered 2018-03-31 – 2018-04-02 (×7): 80 mg via ORAL
  Filled 2018-03-30 (×7): qty 1

## 2018-03-30 MED ORDER — NALOXONE HCL 0.4 MG/ML IJ SOLN: 0.4000 mg | INTRAMUSCULAR | Status: DC | PRN

## 2018-03-30 MED ORDER — HYDROMORPHONE HCL 1 MG/ML IJ SOLN
INTRAMUSCULAR | Status: AC
  Administered 2018-03-30: 0.5 mg
  Filled 2018-03-30: qty 0.5

## 2018-03-30 MED ORDER — DIPHENHYDRAMINE HCL 25 MG PO CAPS
25.0000 mg | ORAL_CAPSULE | ORAL | Status: DC | PRN
  Filled 2018-03-30: qty 1

## 2018-03-30 MED ORDER — DIBUCAINE 1 % RE OINT: 1.0000 "application " | TOPICAL_OINTMENT | RECTAL | Status: DC | PRN

## 2018-03-30 MED ORDER — IBUPROFEN 800 MG PO TABS
800.0000 mg | ORAL_TABLET | Freq: Three times a day (TID) | ORAL | Status: DC
  Filled 2018-03-30: qty 1

## 2018-03-30 MED ORDER — GLYCOPYRROLATE 0.2 MG/ML IJ SOLN
INTRAMUSCULAR | Status: DC | PRN
  Administered 2018-03-30: 0.2 mg via INTRAVENOUS

## 2018-03-30 MED ORDER — SIMETHICONE 80 MG PO CHEW
80.0000 mg | CHEWABLE_TABLET | ORAL | Status: DC
  Administered 2018-03-30 – 2018-03-31 (×2): 80 mg via ORAL
  Filled 2018-03-30 (×2): qty 1

## 2018-03-30 MED ORDER — CEFAZOLIN SODIUM-DEXTROSE 2-3 GM-%(50ML) IV SOLR
INTRAVENOUS | Status: DC | PRN
  Administered 2018-03-30: 2 g via INTRAVENOUS

## 2018-03-30 MED ORDER — SIMETHICONE 80 MG PO CHEW: 80.0000 mg | CHEWABLE_TABLET | ORAL | Status: DC | PRN

## 2018-03-30 MED ORDER — FENTANYL CITRATE (PF) 100 MCG/2ML IJ SOLN: 25.0000 ug | INTRAMUSCULAR | Status: DC | PRN

## 2018-03-30 MED ORDER — ROCURONIUM BROMIDE 100 MG/10ML IV SOLN
INTRAVENOUS | Status: DC | PRN
  Administered 2018-03-30: 30 mg via INTRAVENOUS

## 2018-03-30 MED ORDER — DIPHENHYDRAMINE HCL 50 MG/ML IJ SOLN: 12.5000 mg | INTRAMUSCULAR | Status: DC | PRN

## 2018-03-30 MED ORDER — SCOPOLAMINE 1 MG/3DAYS TD PT72
MEDICATED_PATCH | TRANSDERMAL | Status: DC | PRN
  Administered 2018-03-30: 1 via TRANSDERMAL

## 2018-03-30 MED ORDER — LIDOCAINE HCL (CARDIAC) PF 100 MG/5ML IV SOSY
PREFILLED_SYRINGE | INTRAVENOUS | Status: AC
  Filled 2018-03-30: qty 5

## 2018-03-30 MED ORDER — PRENATAL MULTIVITAMIN CH
1.0000 | ORAL_TABLET | Freq: Every day | ORAL | Status: DC
  Administered 2018-03-31 – 2018-04-01 (×2): 1 via ORAL
  Filled 2018-03-30 (×2): qty 1

## 2018-03-30 MED ORDER — OXYCODONE-ACETAMINOPHEN 5-325 MG PO TABS
2.0000 | ORAL_TABLET | ORAL | Status: DC | PRN
  Administered 2018-03-30: 2 via ORAL
  Administered 2018-03-31 (×2): 1 via ORAL
  Administered 2018-04-01 (×2): 2 via ORAL
  Filled 2018-03-30 (×8): qty 2

## 2018-03-30 MED ORDER — SUGAMMADEX SODIUM 200 MG/2ML IV SOLN
INTRAVENOUS | Status: DC | PRN
  Administered 2018-03-30: 250 mg via INTRAVENOUS

## 2018-03-30 MED ORDER — LACTATED RINGERS IV SOLN: INTRAVENOUS | Status: DC

## 2018-03-30 MED ORDER — KETOROLAC TROMETHAMINE 30 MG/ML IJ SOLN: 30.0000 mg | Freq: Four times a day (QID) | INTRAMUSCULAR | Status: AC | PRN

## 2018-03-30 MED ORDER — HYDROMORPHONE HCL 1 MG/ML IJ SOLN: 0.5000 mg | Freq: Once | INTRAMUSCULAR | Status: DC

## 2018-03-30 MED ORDER — EPHEDRINE 5 MG/ML INJ
INTRAVENOUS | Status: AC
  Filled 2018-03-30: qty 20

## 2018-03-30 MED ORDER — KETOROLAC TROMETHAMINE 30 MG/ML IJ SOLN
30.0000 mg | Freq: Four times a day (QID) | INTRAMUSCULAR | Status: AC | PRN
  Administered 2018-03-30: 30 mg via INTRAVENOUS

## 2018-03-30 MED ORDER — DEXAMETHASONE SODIUM PHOSPHATE 10 MG/ML IJ SOLN
INTRAMUSCULAR | Status: DC | PRN
  Administered 2018-03-30: 4 mg via INTRAVENOUS

## 2018-03-30 MED ORDER — ROCURONIUM BROMIDE 100 MG/10ML IV SOLN
INTRAVENOUS | Status: AC
  Filled 2018-03-30: qty 1

## 2018-03-30 MED ORDER — ONDANSETRON HCL 4 MG/2ML IJ SOLN
INTRAMUSCULAR | Status: AC
  Filled 2018-03-30: qty 2

## 2018-03-30 MED ORDER — BUTORPHANOL TARTRATE 1 MG/ML IJ SOLN
1.0000 mg | Freq: Once | INTRAMUSCULAR | Status: AC
  Administered 2018-03-30: 1 mg via INTRAVENOUS
  Filled 2018-03-30: qty 1

## 2018-03-30 MED ORDER — DEXAMETHASONE SODIUM PHOSPHATE 4 MG/ML IJ SOLN
INTRAMUSCULAR | Status: AC
  Filled 2018-03-30: qty 1

## 2018-03-30 MED ORDER — SCOPOLAMINE 1 MG/3DAYS TD PT72: 1.0000 | MEDICATED_PATCH | Freq: Once | TRANSDERMAL | Status: DC

## 2018-03-30 MED ORDER — DIPHENHYDRAMINE HCL 25 MG PO CAPS: 25.0000 mg | ORAL_CAPSULE | Freq: Four times a day (QID) | ORAL | Status: DC | PRN

## 2018-03-30 MED ORDER — ONDANSETRON HCL 4 MG/2ML IJ SOLN: 4.0000 mg | Freq: Three times a day (TID) | INTRAMUSCULAR | Status: DC | PRN

## 2018-03-30 MED ORDER — LACTATED RINGERS IV BOLUS: 500.0000 mL | Freq: Once | INTRAVENOUS | Status: DC

## 2018-03-30 MED ORDER — PROPOFOL 10 MG/ML IV BOLUS
INTRAVENOUS | Status: DC | PRN
  Administered 2018-03-30: 200 mg via INTRAVENOUS

## 2018-03-30 MED ORDER — PHENYLEPHRINE HCL 10 MG/ML IJ SOLN
INTRAMUSCULAR | Status: DC | PRN
  Administered 2018-03-30: .08 mg via INTRAVENOUS

## 2018-03-30 SURGICAL SUPPLY — 38 items
BENZOIN TINCTURE PRP APPL 2/3 (GAUZE/BANDAGES/DRESSINGS) ×2 IMPLANT
CHLORAPREP W/TINT 26ML (MISCELLANEOUS) ×2 IMPLANT
CLAMP CORD UMBIL (MISCELLANEOUS) IMPLANT
CLOTH BEACON ORANGE TIMEOUT ST (SAFETY) ×2 IMPLANT
DRAPE C SECTION CLR SCREEN (DRAPES) IMPLANT
DRSG OPSITE POSTOP 4X10 (GAUZE/BANDAGES/DRESSINGS) ×2 IMPLANT
ELECT REM PT RETURN 9FT ADLT (ELECTROSURGICAL) ×2
ELECTRODE REM PT RTRN 9FT ADLT (ELECTROSURGICAL) ×1 IMPLANT
EXTRACTOR VACUUM M CUP 4 TUBE (SUCTIONS) IMPLANT
GLOVE BIO SURGEON STRL SZ7.5 (GLOVE) ×2 IMPLANT
GLOVE BIOGEL PI IND STRL 7.0 (GLOVE) ×1 IMPLANT
GLOVE BIOGEL PI INDICATOR 7.0 (GLOVE) ×1
GOWN STRL REUS W/TWL 2XL LVL3 (GOWN DISPOSABLE) ×2 IMPLANT
GOWN STRL REUS W/TWL LRG LVL3 (GOWN DISPOSABLE) ×4 IMPLANT
KIT ABG SYR 3ML LUER SLIP (SYRINGE) IMPLANT
NDL HYPO 25X5/8 SAFETYGLIDE (NEEDLE) IMPLANT
NEEDLE HYPO 22GX1.5 SAFETY (NEEDLE) ×2 IMPLANT
NEEDLE HYPO 25X5/8 SAFETYGLIDE (NEEDLE) IMPLANT
NS IRRIG 1000ML POUR BTL (IV SOLUTION) ×2 IMPLANT
PACK C SECTION WH (CUSTOM PROCEDURE TRAY) ×2 IMPLANT
PAD OB MATERNITY 4.3X12.25 (PERSONAL CARE ITEMS) ×2 IMPLANT
PENCIL SMOKE EVAC W/HOLSTER (ELECTROSURGICAL) ×2 IMPLANT
RTRCTR C-SECT PINK 25CM LRG (MISCELLANEOUS) ×2 IMPLANT
SPONGE LAP 18X18 RF (DISPOSABLE) ×6 IMPLANT
STRIP CLOSURE SKIN 1/2X4 (GAUZE/BANDAGES/DRESSINGS) ×2 IMPLANT
SUT CHROMIC 1 CTX 36 (SUTURE) ×4 IMPLANT
SUT VIC AB 1 CT1 36 (SUTURE) ×4 IMPLANT
SUT VIC AB 2-0 CT1 (SUTURE) ×2 IMPLANT
SUT VIC AB 2-0 CT1 27 (SUTURE) ×1
SUT VIC AB 2-0 CT1 TAPERPNT 27 (SUTURE) ×1 IMPLANT
SUT VIC AB 3-0 CT1 27 (SUTURE) ×2
SUT VIC AB 3-0 CT1 TAPERPNT 27 (SUTURE) ×2 IMPLANT
SUT VIC AB 3-0 SH 27 (SUTURE)
SUT VIC AB 3-0 SH 27X BRD (SUTURE) IMPLANT
SUT VIC AB 4-0 KS 27 (SUTURE) ×2 IMPLANT
SYR BULB IRRIGATION 50ML (SYRINGE) IMPLANT
TOWEL OR 17X24 6PK STRL BLUE (TOWEL DISPOSABLE) ×2 IMPLANT
TRAY FOLEY W/BAG SLVR 14FR LF (SET/KITS/TRAYS/PACK) ×2 IMPLANT

## 2018-03-30 NOTE — Anesthesia Postprocedure Evaluation (Signed)
Anesthesia Post Note  Patient: Wendy Mcfarland  Procedure(s) Performed: CESAREAN SECTION MULTI-GESTATIONAL (N/A )     Patient location during evaluation: PACU Anesthesia Type: General Level of consciousness: awake and alert Pain management: pain level controlled Vital Signs Assessment: post-procedure vital signs reviewed and stable Respiratory status: spontaneous breathing, nonlabored ventilation, respiratory function stable and patient connected to nasal cannula oxygen Cardiovascular status: blood pressure returned to baseline and stable Postop Assessment: no apparent nausea or vomiting Anesthetic complications: no    Last Vitals:  Vitals:   03/30/18 1010 03/30/18 1031  BP:  121/80  Pulse: 66 67  Resp: 18 16  Temp:  36.7 C  SpO2: 100% 100%    Last Pain:  Vitals:   03/30/18 1034  TempSrc:   PainSc: 6    Pain Goal: Patients Stated Pain Goal: 3 (03/30/18 1031)               Shelton SilvasKevin D Hollis

## 2018-03-30 NOTE — Anesthesia Preprocedure Evaluation (Signed)
Anesthesia Evaluation  Patient identified by MRN, date of birth, ID band Patient awake    Reviewed: Unable to perform ROS - Chart review onlyPreop documentation limited or incomplete due to emergent nature of procedure.  Airway Mallampati: II  TM Distance: >3 FB Neck ROM: Full    Dental  (+) Teeth Intact, Dental Advisory Given   Pulmonary former smoker,    breath sounds clear to auscultation       Cardiovascular  Rhythm:Regular Rate:Normal     Neuro/Psych    GI/Hepatic   Endo/Other    Renal/GU      Musculoskeletal   Abdominal   Peds  Hematology   Anesthesia Other Findings   Reproductive/Obstetrics                             Anesthesia Physical Anesthesia Plan  ASA: III and emergent  Anesthesia Plan: General   Post-op Pain Management:    Induction: Intravenous, Rapid sequence and Cricoid pressure planned  PONV Risk Score and Plan: 4 or greater and Ondansetron and Treatment may vary due to age or medical condition  Airway Management Planned: Oral ETT and Video Laryngoscope Planned  Additional Equipment: None  Intra-op Plan:   Post-operative Plan: Extubation in OR  Informed Consent: I have reviewed the patients History and Physical, chart, labs and discussed the procedure including the risks, benefits and alternatives for the proposed anesthesia with the patient or authorized representative who has indicated his/her understanding and acceptance.   Only emergency history available and History available from chart only  Plan Discussed with: CRNA  Anesthesia Plan Comments:         Anesthesia Quick Evaluation

## 2018-03-30 NOTE — Anesthesia Postprocedure Evaluation (Signed)
Anesthesia Post Note  Patient: Nili Robin  Procedure(s) Performed: CESAREAN SECTION MULTI-GESTATIONAL (N/A )     Patient location during evaluation: Mother Baby Anesthesia Type: General Level of consciousness: awake and alert and oriented Pain management: satisfactory to patient Vital Signs Assessment: post-procedure vital signs reviewed and stable Respiratory status: spontaneous breathing and nonlabored ventilation Cardiovascular status: stable Postop Assessment: no headache, no backache, patient able to bend at knees, no signs of nausea or vomiting, adequate PO intake, spinal receding and no apparent nausea or vomiting Anesthetic complications: no    Last Vitals:  Vitals:   03/30/18 1240 03/30/18 1337  BP: 113/62 116/66  Pulse: 64 65  Resp: 20 20  Temp: 36.5 C (!) 36.4 C  SpO2: 100% 100%    Last Pain:  Vitals:   03/30/18 1337  TempSrc: Oral  PainSc:    Pain Goal: Patients Stated Pain Goal: 6 (03/30/18 1330)               Madison Hickman

## 2018-03-30 NOTE — Progress Notes (Signed)
1829- RN called to patients room for "baby's hand out of the vagina".  In house faculty MD Dr. Alysia Penna in department and called to room. Code Caesarean called and patient transported to OR. Dr Ellyn Hack notified.

## 2018-03-30 NOTE — Addendum Note (Signed)
Addendum  created 03/30/18 1430 by Shanon Payor, CRNA   Clinical Note Signed

## 2018-03-30 NOTE — Progress Notes (Signed)
Patient ID: Wendy Mcfarland, female   DOB: [name] 18, 1997, 23 y.o.   MRN: [id number] POD#0 Pt sleeping soundly; no apparent discomfort. Per nursing no complaints. Happy got to hold one of her sons in NICU earlier today VSS  Plan: POD#1 - stable          Routine pp/post op care

## 2018-03-30 NOTE — Anesthesia Procedure Notes (Deleted)
Performed by: Bell Carbo M, CRNA       

## 2018-03-30 NOTE — Anesthesia Procedure Notes (Addendum)
Procedure Name: Intubation Date/Time: 03/30/2018 7:41 AM Performed by: Shanon Payor, CRNA Pre-anesthesia Checklist: Patient identified, Patient being monitored, Timeout performed, Emergency Drugs available and Suction available Patient Re-evaluated:Patient Re-evaluated prior to induction Oxygen Delivery Method: Circle System Utilized and Circle system utilized Preoxygenation: Pre-oxygenation with 100% oxygen Induction Type: IV induction, Rapid sequence and Cricoid Pressure applied Ventilation: Mask ventilation without difficulty Laryngoscope Size: 3 and Glidescope Grade View: Grade I Tube type: Oral Tube size: 7.0 mm Number of attempts: 1 Airway Equipment and Method: Video-laryngoscopy and Rigid stylet Placement Confirmation: ETT inserted through vocal cords under direct vision,  positive ETCO2 and breath sounds checked- equal and bilateral Secured at: 21 cm Tube secured with: Tape Dental Injury: Teeth and Oropharynx as per pre-operative assessment  Comments: Performed by Dr. Hart Rochester

## 2018-03-30 NOTE — Progress Notes (Signed)
Patient ID: Wendy Mcfarland, female   DOB: 05/18/1995, 23 y.o.   MRN: [id number]  30+ di/di twins, PPROM  CTSP secondary to increasing pain from ctx  AFVSS States pain comes and goes as squeezing in front and back Discharge is now tannish  No VB, no increasing LOF, +FM x 2  gen uncomfortable Abd soft, gravid.  Intermittent exam initially tender - distractable from pain  FHTs 120 mod var, + accels, category 1 FHTs 130's, mod var, + accels, category 1 toco was q 4-73min, then irritability, some contractions/irritability  No vaginal pressure  Will check CBC and CMP give IVF bolus Recent 2nd dose BMZ Babies on toco and FHT Bladder emptied, position changed Stadol 1mg  Close monitoring

## 2018-03-30 NOTE — Transfer of Care (Signed)
Immediate Anesthesia Transfer of Care Note  Patient: Wendy Mcfarland  Procedure(s) Performed: CESAREAN SECTION MULTI-GESTATIONAL (N/A )  Patient Location: PACU  Anesthesia Type:General  Level of Consciousness: awake, alert  and oriented  Airway & Oxygen Therapy: Patient Spontanous Breathing and Patient connected to nasal cannula oxygen  Post-op Assessment: Report given to RN and Post -op Vital signs reviewed and stable  Post vital signs: Reviewed and stable  Last Vitals:  Vitals Value Taken Time  BP 116/66 03/30/2018  1:37 PM  Temp 36.4 C 03/30/2018  1:37 PM  Pulse 65 03/30/2018  1:37 PM  Resp 20 03/30/2018  1:37 PM  SpO2 100 % 03/30/2018  1:37 PM    Last Pain:  Vitals:   03/30/18 1337  TempSrc: Oral  PainSc:       Patients Stated Pain Goal: 6 (03/30/18 1234)  Complications: No apparent anesthesia complications

## 2018-03-30 NOTE — Brief Op Note (Signed)
03/30/2018  10:56 AM  PATIENT:  Wendy Mcfarland  23 y.o. female  PRE-OPERATIVE DIAGNOSIS:  primary c-section; malpresentation; preterm; stat  POST-OPERATIVE DIAGNOSIS:  primary c-section; malpresentation; preterm; stat  PROCEDURE:  Procedure(s): CESAREAN SECTION MULTI-GESTATIONAL (N/A)  SURGEON:  Surgeon(s) and Role:    * Hermina Staggers, MD - Primary    * Bovard-Stuckert, Dalessandro Baldyga, MD  ASSISTANTS: Webb Silversmith RNFA   FINDINGS viable female infants - A at7:41 - arm and vertex presentation 2#11.4oz apgars 7/9, B at 7:42 3#2.8oz apgars 7/7, double footling breech; nl uterus, tubes and ovaries.  ANESTHESIA:   general  EBL:  800 mL IVF and UOP per anesthesia, clear urine at the end of the case   BLOOD ADMINISTERED:none  DRAINS: Urinary Catheter (Foley)   LOCAL MEDICATIONS USED:  NONE  SPECIMEN:  Source of Specimen:  Placenta  DISPOSITION OF SPECIMEN:  PATHOLOGY  COUNTS:  YES  TOURNIQUET:  * No tourniquets in log *  DICTATION: .Other Dictation: Dictation Number [id number]  PLAN OF CARE: Admit to inpatient   PATIENT DISPOSITION:  PACU - hemodynamically stable.   Delay start of Pharmacological VTE agent (>24hrs) due to surgical blood loss or risk of bleeding: not applicable

## 2018-03-31 LAB — CULTURE, BETA STREP (GROUP B ONLY)

## 2018-03-31 LAB — CBC
HCT: 22.3 % — ABNORMAL LOW (ref 36.0–46.0)
Hemoglobin: 7.5 g/dL — ABNORMAL LOW (ref 12.0–15.0)
MCH: 29.3 pg (ref 26.0–34.0)
MCHC: 33.6 g/dL (ref 30.0–36.0)
MCV: 87.1 fL (ref 80.0–100.0)
Platelets: 137 10*3/uL — ABNORMAL LOW (ref 150–400)
RBC: 2.56 MIL/uL — ABNORMAL LOW (ref 3.87–5.11)
RDW: 13 % (ref 11.5–15.5)
WBC: 16 10*3/uL — ABNORMAL HIGH (ref 4.0–10.5)
nRBC: 0 % (ref 0.0–0.2)

## 2018-03-31 NOTE — Op Note (Signed)
Wendy Mcfarland, FACEY MEDICAL RECORD XY:[id number] ACCOUNT [id number] DATE OF BIRTH:Jul 23, 1995 FACILITY: [facility] LOCATION: [id number] PHYSICIAN:Lauren Aguayo BOVARD-STUCKERT, MD  OPERATIVE REPORT  DATE OF PROCEDURE:  03/30/2018  PREOPERATIVE DIAGNOSIS:  Primary C-section for malpresentation of twins A and B, preterm and stat.  POSTOPERATIVE DIAGNOSIS:  Primary C-section for malpresentation of twins A and B, preterm and stat, delivered.  PROCEDURE:  Low transverse cesarean section.  SURGEON:  Nettie Elm, MD; Sherian Rein, MD  ASSISTANT:  Webb Silversmith, RNFA   FINDINGS:  Viable female infants, baby A at 7:41 a.m. with an arm in vertex presentation, weighing 2 pounds 11.4 ounces and Apgars of 7 and 9.  Baby B at 7:42, weighing 2 pounds 2.8 ounces, Apgars of 7 at one minute and 7 at five minutes and presentation  of double footling breech.  Normal postpartum uterus, tubes, and ovaries are noted.  ANESTHESIA:  General.  ESTIMATED BLOOD LOSS:  800 mL.  URINE OUTPUT AND INTRAVENOUS FLUIDS:  Per anesthesia.  Clear urine at the end of the case per anesthesia.  COMPLICATIONS:  Patient had been having some back pain.  No complaints of vaginal pressure as well as intermittent contractions.  She was made comfortable with 1 of Stadol and then had an arm and hand presenting from her vagina.  The decision was made to  proceed with a stat section.  We had previously discussed risks, benefits and alternatives and the likelihood of her delivering as a section.  As I was at a meeting at [facility], Dr. Alysia Penna started the case.  PATHOLOGY:  Placenta to path.  PROCEDURE:  I entered the operating room directly after the babies had been delivered.  It was reported to me that the incision was a low transverse incision.  After delivery of the 2 babies, the placenta was expressed.  The uterus was cleared of all  clot and debris.  Uterine incision was closed with 2 layers of 0 Monocryl, the first as a locked and the  second as an imbricating layer.  The incision was noted to be hemostatic.  The clots and debris were cleared from the gutters.  Normal ovaries and  tubes were also noted.  The peritoneum was reapproximated using 2-0 Vicryl.  The fascia was closed with 0 Vicryl in a running fashion from either end, overlapping in the midline.  Subcuticular adipose layer was made hemostatic with Bovie cautery, and the  dead space was closed with plain gut.  The skin was closed with 4-0 Vicryl in a subcuticular fashion.  A Prevena dressing was applied.  The patient tolerated the procedure well.  Sponge, lap and needle counts were correct x2 per the operating room  staff.  LN/NUANCE  D:03/30/2018 T:03/30/2018 JOB:[id number]

## 2018-03-31 NOTE — Progress Notes (Signed)
Subjective: Postpartum Day 1: Stat Cesarean Delivery Patient reports incisional pain, tolerating PO and no problems voiding.    Objective: Vital signs in last 24 hours: Temp:  [97.5 F (36.4 C)-99.1 F (37.3 C)] 99.1 F (37.3 C) (01/10 0817) Pulse Rate:  [57-82] 71 (01/10 0817) Resp:  [10-28] 18 (01/10 0817) BP: (111-127)/(56-89) 124/79 (01/10 0817) SpO2:  [99 %-100 %] 100 % (01/10 0817)  Physical Exam:  General: alert and cooperative Lochia: appropriate Uterine Fundus: firm Incision: clean dry intact   Recent Labs    03/30/18 0637 03/31/18 0515  HGB 9.4* 7.5*  HCT 28.0* 22.3*    Assessment/Plan: Status post Cesarean section. Doing well postoperatively.  Continue current care. Babies doing well in NICU on CPAP  Oliver Pila 03/31/2018, 8:22 AM

## 2018-03-31 NOTE — Lactation Note (Signed)
This note was copied from a baby's chart. Lactation Consultation Note: Initial visit with this mom of twins born at 6130 w 1 d in NICU. Mom reports she pumped 2 times yesterday but did not obtain any Colostrum. Reviewed hand expression. Encouraged to pump 8 times/24 hours. [facility]. BF brochure and NICU booklet given to mom. Reviewed our phone number for support after DC. Discussed pumping rooms in NICU to pump when visiting babies. Encouraged to call for assist when ready to put babies to the breast.  No questions at present. To call prn Patient Name: Wendy Mcfarland Today's Date: 03/31/2018 Reason for consult: Initial assessment   Maternal Data Formula Feeding for Exclusion: Yes Reason for exclusion: Admission to Intensive Care Unit (ICU) post-partum Has patient been taught Hand Expression?: Yes Does the patient have breastfeeding experience prior to this delivery?: No  Feeding Feeding Type: Donor Breast Milk  LATCH Score                   Interventions    Lactation Tools Discussed/Used WIC Program: Yes Pump Review: Setup, frequency, and cleaning Initiated by:: RN Date initiated:: 03/30/18   Consult Status Consult Status: Follow-up Date: 04/01/18 Follow-up type: In-patient    Pamelia HoitWeeks, Shanetta Nicolls D 03/31/2018, 7:38 AM

## 2018-04-01 MED ORDER — POLYSACCHARIDE IRON COMPLEX 150 MG PO CAPS
150.0000 mg | ORAL_CAPSULE | Freq: Every day | ORAL | Status: DC
  Administered 2018-04-01 – 2018-04-02 (×2): 150 mg via ORAL
  Filled 2018-04-01 (×3): qty 1

## 2018-04-01 NOTE — Lactation Note (Signed)
This note was copied from a baby's chart. Lactation Consultation Note  Patient Name: Boy Petula Roderic ScarceMiles Today's Date: 04/01/2018  Mom is pumping every 3 hours but not obtaining colostrum. Reassured and discussed milk coming to volume.  Instructed to pump and hand express 8-12 times/24 hours.  [facility].  Encouraged to call for assist/concerns prn.   Maternal Data    Feeding    LATCH Score                   Interventions    Lactation Tools Discussed/Used     Consult Status      Huston FoleyMOULDEN, Deyvi Bonanno S 04/01/2018, 9:09 AM

## 2018-04-01 NOTE — Progress Notes (Signed)
Subjective: Postpartum Day 2 Cesarean Delivery Patient reports tolerating PO and no problems voiding.  Ambulating well.  Pumping  Objective: Vital signs in last 24 hours: Temp:  [98.5 F (36.9 C)-99.5 F (37.5 C)] 98.6 F (37 C) (01/11 0810) Pulse Rate:  [60-87] 60 (01/11 0810) Resp:  [18] 18 (01/11 0810) BP: (102-124)/(49-78) 114/63 (01/11 0810) SpO2:  [100 %] 100 % (01/11 0810)  Physical Exam:  General: alert and cooperative Lochia: appropriate Uterine Fundus: firm Incision: dressing clean dry and intact to suction   Recent Labs    03/30/18 0637 03/31/18 0515  HGB 9.4* 7.5*  HCT 28.0* 22.3*    Assessment/Plan: Status post Cesarean section. Doing well postoperatively.  Tolerating relative anemia fine, will start iron daily Babies stable in NICU   Continue current care.  Oliver Pila 04/01/2018, 9:57 AM

## 2018-04-01 NOTE — Progress Notes (Addendum)
Patient ID: Wendy Mcfarland, female   DOB: 05/20/1995, 23 y.o.   MRN: [id number] Late Entry  I was called to room 319 on the morning of 03/30/18 for possible delivery @ 7 AM. Pt of Dr Ellyn HackBovard, admitted with twin gestation and PROM.  Nurse informed me of infant hand in the vagina. I called a Code Ceseran and instructed nursing to notify Dr Ellyn HackBovard. Explained to pt and partner need for stat c section. Pt was take to OR # 1 for c section. Please see OP note for additional information

## 2018-04-02 MED ORDER — OXYCODONE-ACETAMINOPHEN 5-325 MG PO TABS: 2.0000 | ORAL_TABLET | ORAL | 0 refills | Status: DC | PRN

## 2018-04-02 MED ORDER — POLYSACCHARIDE IRON COMPLEX 150 MG PO CAPS: 150.0000 mg | ORAL_CAPSULE | Freq: Every day | ORAL | 1 refills | Status: DC

## 2018-04-02 NOTE — Discharge Summary (Signed)
OB Discharge Summary     Patient Name: Wendy Mcfarland DOB: 11-Apr-1995 MRN: [id number]  Date of admission: 03/29/2018 Delivering MD:    Gaby, Thorner Diala [[id number]]  Dynisty, Sapia Shenicka [[id number]]  Hermina Staggers   Date of discharge: 04/02/2018  Admitting diagnosis: 30WKS WATER BROKE,TWINS Intrauterine pregnancy: [redacted]w[redacted]d     Secondary diagnosis:  Principal Problem:   S/P cesarean section Active Problems:   Preterm premature rupture of membranes (PPROM) with unknown onset of labor  Additional problems: labor and malpresentation of twins    Discharge diagnosis: Preterm Pregnancy Delivered                                                                                                Post partum procedures:none  Complications: ROM>24 hours  Hospital course:  Onset of Labor With Unplanned C/S  23 y.o. yo G1P0102 at [redacted]w[redacted]d was admitted for PPROM with twin gestation and ROM of twin A.  Baby A was transverse.  After a 24 hour latency prior she went into labor with a fetal hand presenting on the AM of 03/30/18.Marland Kitchen Patient then had a stat c-section . Membrane Rupture Time/Date:    Anetta, Vigo Nikcole [[id number]]  2:30 AM   Shavanna, Hemmingway Annison [[id number]]  2:30 AM ,   Kath, Lamond Kana [[id number]]  03/29/2018   Charlie, Foore Maudene [[id number]]  03/29/2018   The patient went for cesarean section due to [facility] and Multifetal Gestation, and delivered a Viable infant,   Anaila, Colby Brayden [[id number]]  03/30/2018   Baran, Meincke Louretta [[id number]]  03/30/2018  Details of operation can be found in separate operative note. Patient had an uncomplicated postpartum course except for relative anemia which she tolerated well.   She is ambulating,tolerating a regular diet, passing flatus, and urinating well.  Patient is discharged home in stable condition 04/02/18.  Physical exam  Vitals:   04/01/18 0810 04/01/18 1211 04/01/18 2255 04/02/18 0542  BP: 114/63 121/66 (!) 105/59  127/64  Pulse: 60 65 68 (!) 57  Resp: 18 16 16 16   Temp: 98.6 F (37 C) 98.3 F (36.8 C) 98.4 F (36.9 C) 98.7 F (37.1 C)  TempSrc: Oral Oral  Oral  SpO2: 100% 100% 100% 99%  Weight:      Height:       General: alert and cooperative Lochia: appropriate Uterine Fundus: firm Incision: Dressing is clean, dry, and intact  Labs: Lab Results  Component Value Date   WBC 16.0 (H) 03/31/2018   HGB 7.5 (L) 03/31/2018   HCT 22.3 (L) 03/31/2018   MCV 87.1 03/31/2018   PLT 137 (L) 03/31/2018   CMP Latest Ref Rng & Units 03/30/2018  Glucose 70 - 99 mg/dL 122(Q)  BUN 6 - 20 mg/dL 9  Creatinine 8.25 - 0.03 mg/dL 7.04  Sodium 888 - 916 mmol/L 134(L)  Potassium 3.5 - 5.1 mmol/L 3.2(L)  Chloride 98 - 111 mmol/L 108  CO2 22 - 32 mmol/L 17(L)  Calcium 8.9 - 10.3 mg/dL 9.4(H)  Total Protein 6.5 - 8.1 g/dL 0.3(U)  Total Bilirubin 0.3 -  1.2 mg/dL 0.5  Alkaline Phos 38 - 126 U/L 70  AST 15 - 41 U/L 15  ALT 0 - 44 U/L 13    Discharge instruction: per After Visit Summary and "Baby and Me Booklet".  After visit meds:  Allergies as of 04/02/2018      Reactions   Penicillins Hives   DID THE REACTION INVOLVE: Swelling of the face/tongue/throat, SOB, or low BP? No Sudden or severe rash/hives, skin peeling, or the inside of the mouth or nose? Yes Did it require medical treatment? No When did it last happen?Childhood If all above answers are "NO", may proceed with cephalosporin use.      Medication List    TAKE these medications   iron polysaccharides 150 MG capsule Commonly known as:  NIFEREX Take 1 capsule (150 mg total) by mouth daily. Start taking on:  April 03, 2018   oxyCODONE-acetaminophen 5-325 MG tablet Commonly known as:  PERCOCET/ROXICET Take 2 tablets by mouth every 4 (four) hours as needed for severe pain ((when tolerating fluids)).   prenatal multivitamin Tabs tablet Take 1 tablet by mouth at bedtime.       Diet: routine diet  Activity: Advance as  tolerated. Pelvic rest for 6 weeks.   Outpatient follow up:1 week Follow up Appt:No future appointments. Follow up Visit:No follow-ups on file.  Postpartum contraception: Undecided  Newborn Data:   Teressa SenterMiles, BoyA Niyanna [[id number]][[id number]]  Live born female  Birth Weight: 2 lb 11.4 oz (1230 g) APGAR: 7, 9  Newborn Delivery   Birth date/time:  03/30/2018 07:41:00 Delivery type:  C-Section, Low Transverse Trial of labor:  No C-section categorization:  Primary      Corky DownsMiles, BoyB Willard [[id number]][[id number]]  Live born female  Birth Weight: 3 lb 2.8 oz (1440 g) APGAR: 7, 9  Newborn Delivery   Birth date/time:  03/30/2018 07:42:00 Delivery type:  C-Section, Low Transverse Trial of labor:  No C-section categorization:  Primary     Baby Feeding: Bottle and Breast Disposition:NICU   04/02/2018 Oliver PilaKathy W Omari Mcmanaway, MD

## 2018-04-02 NOTE — Progress Notes (Signed)
Subjective: Postpartum Day 3: Stat Cesarean Delivery Patient reports tolerating PO, + flatus and no problems voiding.    Objective: Vital signs in last 24 hours: Temp:  [98.3 F (36.8 C)-98.7 F (37.1 C)] 98.7 F (37.1 C) (01/12 0542) Pulse Rate:  [57-68] 57 (01/12 0542) Resp:  [16] 16 (01/12 0542) BP: (105-127)/(59-66) 127/64 (01/12 0542) SpO2:  [99 %-100 %] 99 % (01/12 0542)  Physical Exam:  General: alert and cooperative Lochia: appropriate Uterine Fundus: firm Incision: C/D/I with Prevena dressing   Recent Labs    03/31/18 0515  HGB 7.5*  HCT 22.3*    Assessment/Plan: Status post Cesarean section. Doing well postoperatively. Started on iron for relative anemia, but tolerating well Babies stable in NICU, plan circumcision eventually.  D/w pt office vs hospital based procedure and will likely proceed here in hospital.   Cannot take NSAID's so d/c on tylenol and oxycodone  Oliver Pila 04/02/2018, 10:04 AM

## 2018-04-12 ENCOUNTER — Encounter (HOSPITAL_COMMUNITY): Payer: Self-pay

## 2018-04-12 ENCOUNTER — Emergency Department (HOSPITAL_COMMUNITY): Payer: BLUE CROSS/BLUE SHIELD

## 2018-04-12 ENCOUNTER — Emergency Department (HOSPITAL_COMMUNITY)
Admission: EM | Admit: 2018-04-12 | Discharge: 2018-04-12 | Disposition: A | Discharge status: Home / Self Care | Payer: BLUE CROSS/BLUE SHIELD | Attending: Emergency Medicine | Admitting: Emergency Medicine

## 2018-04-12 ENCOUNTER — Other Ambulatory Visit: Payer: Self-pay

## 2018-04-12 DIAGNOSIS — Y999 Unspecified external cause status: Secondary | ICD-10-CM | POA: Diagnosis not present

## 2018-04-12 DIAGNOSIS — Y9389 Activity, other specified: Secondary | ICD-10-CM | POA: Insufficient documentation

## 2018-04-12 DIAGNOSIS — Z79899 Other long term (current) drug therapy: Secondary | ICD-10-CM | POA: Insufficient documentation

## 2018-04-12 DIAGNOSIS — Z87891 Personal history of nicotine dependence: Secondary | ICD-10-CM | POA: Insufficient documentation

## 2018-04-12 DIAGNOSIS — M546 Pain in thoracic spine: Secondary | ICD-10-CM | POA: Diagnosis not present

## 2018-04-12 DIAGNOSIS — Y92410 Unspecified street and highway as the place of occurrence of the external cause: Secondary | ICD-10-CM | POA: Insufficient documentation

## 2018-04-12 DIAGNOSIS — S63690A Other sprain of right index finger, initial encounter: Secondary | ICD-10-CM | POA: Insufficient documentation

## 2018-04-12 DIAGNOSIS — S161XXA Strain of muscle, fascia and tendon at neck level, initial encounter: Secondary | ICD-10-CM | POA: Diagnosis not present

## 2018-04-12 DIAGNOSIS — S6991XA Unspecified injury of right wrist, hand and finger(s), initial encounter: Secondary | ICD-10-CM | POA: Diagnosis present

## 2018-04-12 NOTE — Discharge Instructions (Signed)
Take Tylenol as needed for the next week for pain. Take this medicine with food. Try topical medicines such as icy hot or bengay for muscle pain Use a heating pad for sore muscles - use for 20 minutes several times a day Return for worsening symptoms
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
## 2018-04-30 ENCOUNTER — Ambulatory Visit: Payer: Self-pay

## 2018-04-30 NOTE — Lactation Note (Signed)
This note was copied from a baby's chart. Lactation Consultation Note  Patient Name: Wendy Mcfarland Date: 04/30/2018 Reason for consult: Initial assessment;Mother's request;Primapara;1st time breastfeeding;NICU baby;Preterm <34wks  Twins del @ 30wks, now 34.3 today.  Requested to come to NICU by RN for babies that have started the 72 hr breastfeeding protocol, plans to put the babies to the breast. Debbie RN states A was successful at the breast @ the 1100 feeding and appeared to be suckling well, states both babies have been showing feeding cues. Mom currently at bedside and would like to put both babies to the breast at this time. Mom states Baby B is not feeding as well as A. Discussed with mom about expectations from a 34 wk baby in terms of breastfeeding, tiring out easily at the breast, unable to open mouth wide enough to latch, limiting feeding sessions to 30 minutes maximum. Demonstrated hand expression to mom and milk easily expressed.  BABY B: Drops given to baby B via LC's gloved finger after mom easily expressed using hand expression.  Attempted to latch to same breast (left) in cross-cradle position without shield.  Baby B also became frustrated easily and #24 NS utilized.  Infant actively suckling and audible swallows noted.  Infant paused at intervals but resumed suckling with minimal stimulation. Baby B breastfed well x23min before falling asleep. Reinforced to mom that baby exceeded expectations greatly and may not always feed this well at every attempt at the breast.  Maternal Data Has patient been taught Hand Expression?: Yes(demonstrated) Does the patient have breastfeeding experience prior to this delivery?: No  Feeding Feeding Type: Breast Milk  LATCH Score Latch: Grasps breast easily, tongue down, lips flanged, rhythmical sucking.  Audible Swallowing: Spontaneous and intermittent  Type of Nipple: Everted at rest and after stimulation  Comfort  (Breast/Nipple): Soft / non-tender  Hold (Positioning): Full assist, staff holds infant at breast  LATCH Score: 8  Interventions Interventions: Breast feeding basics reviewed;Assisted with latch;Skin to skin;Breast massage;Hand express;Breast compression;Support pillows  Lactation Tools Discussed/Used Tools: Nipple Shields Nipple shield size: 24   Consult Status Consult Status: Follow-up Date: 04/30/18 Follow-up type: Call as needed    Wendy Mcfarland 04/30/2018, 4:01 PM

## 2018-04-30 NOTE — Lactation Note (Signed)
This note was copied from a baby's chart. Lactation Consultation Note  Patient Name: Wendy Mcfarland Achante Sturdy Date: 04/30/2018 Reason for consult: Initial assessment;Mother's request;NICU baby;Preterm <34wks;1st time breastfeeding;Primapara  Twins del @ 30wks, now 34.3 today.  Requested to come to NICU by RN for babies that have started the 72 hr breastfeeding protocol, plans to put the babies to the breast. Debbie RN states A was successful at the breast @ the 1100 feeding and appeared to be suckling well, states both babies have been showing feeding cues. Mom currently at bedside and would like to put both babies to the breast at this time. Discussed with mom about expectations from a 34 wk baby in terms of breastfeeding, tiring out easily at the breast, unable to open mouth wide enough to latch, limiting feeding sessions to 30 minutes maximum. Demonstrated hand expression to mom and milk easily expressed.  BABY A: Drops given to baby A via LC's gloved finger. Attempted to latch infant to left breast in cross-cradle position with LC holding infant to breast.  Infant became frustrated and upset easily. #24 NS placed and instructed mom in application and use. Infant latched and suckles elicited with stimulation. Minimal swallowing audible. Infant latched multiple times in attempts to elicit increased suckling. Reassured mom this is typical behaviors expected from babies at this gestation.  Debbie started NG tube feeding during breastfeeding. Feeding ended by [facility] @ 30 min and infant placed in bassinet to finish tube feeding.  Mom educated how to wash and store the shield.  Mom states she is pumping regularly every 3 hours with yield of about 5 oz combined from both breasts.  Praised mom for her efforts and dedication.  However discussed with mom that the babies may not breastfeed well until corrected term gestational age.  Encouraged mom to call with any concerns.  Maternal Data Has patient been  taught Hand Expression?: Yes(demonstrated) Does the patient have breastfeeding experience prior to this delivery?: No  LATCH Score Latch: Repeated attempts needed to sustain latch, nipple held in mouth throughout feeding, stimulation needed to elicit sucking reflex.  Audible Swallowing: None  Type of Nipple: Everted at rest and after stimulation  Comfort (Breast/Nipple): Soft / non-tender  Hold (Positioning): Full assist, staff holds infant at breast  LATCH Score: 5  Interventions Interventions: Assisted with latch;Skin to skin;Breast massage;Hand express;Breast compression;Support pillows;Expressed milk  Lactation Tools Discussed/Used Tools: Nipple Shields Nipple shield size: 24   Consult Status Consult Status: Follow-up Date: 04/30/18 Follow-up type: Call as needed    Virgia Land 04/30/2018, 3:37 PM

## 2019-01-05 ENCOUNTER — Emergency Department (HOSPITAL_COMMUNITY)
Admission: EM | Admit: 2019-01-05 | Discharge: 2019-01-05 | Disposition: A | Discharge status: Home / Self Care | Payer: BC Managed Care – PPO | Source: Home / Self Care | Attending: Emergency Medicine | Admitting: Emergency Medicine

## 2019-01-05 ENCOUNTER — Emergency Department (HOSPITAL_COMMUNITY)
Admission: EM | Admit: 2019-01-05 | Discharge: 2019-01-05 | Disposition: A | Discharge status: Home / Self Care | Payer: BC Managed Care – PPO | Attending: Emergency Medicine | Admitting: Emergency Medicine

## 2019-01-05 ENCOUNTER — Encounter (HOSPITAL_COMMUNITY): Payer: Self-pay | Admitting: Emergency Medicine

## 2019-01-05 ENCOUNTER — Other Ambulatory Visit: Payer: Self-pay

## 2019-01-05 ENCOUNTER — Emergency Department (HOSPITAL_COMMUNITY): Payer: BC Managed Care – PPO

## 2019-01-05 DIAGNOSIS — R110 Nausea: Secondary | ICD-10-CM | POA: Insufficient documentation

## 2019-01-05 DIAGNOSIS — R1013 Epigastric pain: Secondary | ICD-10-CM | POA: Diagnosis present

## 2019-01-05 DIAGNOSIS — Z5321 Procedure and treatment not carried out due to patient leaving prior to being seen by health care provider: Secondary | ICD-10-CM | POA: Insufficient documentation

## 2019-01-05 DIAGNOSIS — Z79899 Other long term (current) drug therapy: Secondary | ICD-10-CM | POA: Insufficient documentation

## 2019-01-05 DIAGNOSIS — R109 Unspecified abdominal pain: Secondary | ICD-10-CM

## 2019-01-05 DIAGNOSIS — Z87891 Personal history of nicotine dependence: Secondary | ICD-10-CM | POA: Insufficient documentation

## 2019-01-05 LAB — COMPREHENSIVE METABOLIC PANEL
ALT: 17 U/L (ref 0–44)
ALT: 17 U/L (ref 0–44)
AST: 19 U/L (ref 15–41)
AST: 18 U/L (ref 15–41)
Albumin: 3.9 g/dL (ref 3.5–5.0)
Albumin: 3.8 g/dL (ref 3.5–5.0)
Alkaline Phosphatase: 92 U/L (ref 38–126)
Alkaline Phosphatase: 92 U/L (ref 38–126)
Anion gap: 9 (ref 5–15)
Anion gap: 7 (ref 5–15)
BUN: 9 mg/dL (ref 6–20)
BUN: 7 mg/dL (ref 6–20)
CO2: 20 mmol/L — ABNORMAL LOW (ref 22–32)
CO2: 21 mmol/L — ABNORMAL LOW (ref 22–32)
Calcium: 8.6 mg/dL — ABNORMAL LOW (ref 8.9–10.3)
Calcium: 8.4 mg/dL — ABNORMAL LOW (ref 8.9–10.3)
Chloride: 107 mmol/L (ref 98–111)
Chloride: 109 mmol/L (ref 98–111)
Creatinine, Ser: 0.67 mg/dL (ref 0.44–1.00)
Creatinine, Ser: 0.62 mg/dL (ref 0.44–1.00)
GFR calc Af Amer: >60 mL/min (ref >60)
GFR calc Af Amer: >60 mL/min (ref >60)
GFR calc non Af Amer: >60 mL/min (ref >60)
GFR calc non Af Amer: >60 mL/min (ref >60)
Glucose, Bld: 129 mg/dL — ABNORMAL HIGH (ref 70–99)
Glucose, Bld: 111 mg/dL — ABNORMAL HIGH (ref 70–99)
Potassium: 3.8 mmol/L (ref 3.5–5.1)
Potassium: 4 mmol/L (ref 3.5–5.1)
Sodium: 136 mmol/L (ref 135–145)
Sodium: 137 mmol/L (ref 135–145)
Total Bilirubin: 0.9 mg/dL (ref 0.3–1.2)
Total Bilirubin: 0.8 mg/dL (ref 0.3–1.2)
Total Protein: 7 g/dL (ref 6.5–8.1)
Total Protein: 6.8 g/dL (ref 6.5–8.1)

## 2019-01-05 LAB — CBC
HCT: 33.6 % — ABNORMAL LOW (ref 36.0–46.0)
HCT: 32.8 % — ABNORMAL LOW (ref 36.0–46.0)
Hemoglobin: 10 g/dL — ABNORMAL LOW (ref 12.0–15.0)
Hemoglobin: 9.6 g/dL — ABNORMAL LOW (ref 12.0–15.0)
MCH: 22.4 pg — ABNORMAL LOW (ref 26.0–34.0)
MCH: 22.2 pg — ABNORMAL LOW (ref 26.0–34.0)
MCHC: 29.8 g/dL — ABNORMAL LOW (ref 30.0–36.0)
MCHC: 29.3 g/dL — ABNORMAL LOW (ref 30.0–36.0)
MCV: 75.3 fL — ABNORMAL LOW (ref 80.0–100.0)
MCV: 75.8 fL — ABNORMAL LOW (ref 80.0–100.0)
Platelets: 222 10*3/uL (ref 150–400)
Platelets: 219 10*3/uL (ref 150–400)
RBC: 4.46 MIL/uL (ref 3.87–5.11)
RBC: 4.33 MIL/uL (ref 3.87–5.11)
RDW: 17.1 % — ABNORMAL HIGH (ref 11.5–15.5)
RDW: 17 % — ABNORMAL HIGH (ref 11.5–15.5)
WBC: 7.1 10*3/uL (ref 4.0–10.5)
WBC: 5.6 10*3/uL (ref 4.0–10.5)
nRBC: 0 % (ref 0.0–0.2)
nRBC: 0 % (ref 0.0–0.2)

## 2019-01-05 LAB — URINALYSIS, ROUTINE W REFLEX MICROSCOPIC
Bilirubin Urine: NEGATIVE
Bilirubin Urine: NEGATIVE
Glucose, UA: NEGATIVE mg/dL
Glucose, UA: NEGATIVE mg/dL
Hgb urine dipstick: NEGATIVE
Hgb urine dipstick: NEGATIVE
Ketones, ur: 5 mg/dL — AB
Ketones, ur: 5 mg/dL — AB
Leukocytes,Ua: NEGATIVE
Leukocytes,Ua: NEGATIVE
Nitrite: NEGATIVE
Nitrite: POSITIVE — AB
Protein, ur: NEGATIVE mg/dL
Protein, ur: NEGATIVE mg/dL
Specific Gravity, Urine: 1.03 (ref 1.005–1.030)
Specific Gravity, Urine: 1.016 (ref 1.005–1.030)
pH: 5 (ref 5.0–8.0)
pH: 5 (ref 5.0–8.0)

## 2019-01-05 LAB — I-STAT BETA HCG BLOOD, ED (MC, WL, AP ONLY)
I-stat hCG, quantitative: <5 m[IU]/mL (ref <5)
I-stat hCG, quantitative: <5 m[IU]/mL (ref <5)

## 2019-01-05 LAB — LIPASE, BLOOD
Lipase: 25 U/L (ref 11–51)
Lipase: 17 U/L (ref 11–51)

## 2019-01-05 LAB — TROPONIN I (HIGH SENSITIVITY)
Troponin I (High Sensitivity): 4 ng/L (ref <18)
Troponin I (High Sensitivity): <2 ng/L (ref <18)

## 2019-01-05 MED ORDER — CEPHALEXIN 500 MG PO CAPS: 500.0000 mg | ORAL_CAPSULE | Freq: Four times a day (QID) | ORAL | 0 refills | Status: AC

## 2019-01-05 MED ORDER — IOHEXOL 300 MG/ML  SOLN
100.0000 mL | Freq: Once | INTRAMUSCULAR | Status: AC | PRN
  Administered 2019-01-05: 100 mL via INTRAVENOUS

## 2019-01-05 MED ORDER — SODIUM CHLORIDE 0.9% FLUSH
3.0000 mL | Freq: Once | INTRAVENOUS | Status: AC
  Administered 2019-01-05: 3 mL via INTRAVENOUS

## 2019-01-05 MED ORDER — SODIUM CHLORIDE 0.9% FLUSH: 3.0000 mL | Freq: Once | INTRAVENOUS | Status: DC

## 2019-01-05 NOTE — ED Notes (Signed)
CT states this patient is next to be transported to CT

## 2019-01-05 NOTE — ED Notes (Signed)
Garrett, PA at bedside.  

## 2019-01-05 NOTE — ED Notes (Signed)
Patient transported to CT 

## 2019-01-05 NOTE — ED Provider Notes (Signed)
[facility] EMERGENCY DEPARTMENT Provider Note   CSN: [id number] Arrival date & time: 01/05/19  5277     History   Chief Complaint Chief Complaint  Patient presents with  . Abdominal Pain  . Flank Pain    HPI Wendy Mcfarland is a 23 y.o. female with past medical history is significant for duodenal switch procedure who presents to the ED with 1 month history intermittent periumbilical abdominal pain.  She denies any obvious triggers or aggravating factors and denies any association with meals or lying down.  She describes the discomfort as "achy".  She ignored her symptoms until last night she experienced significant, 10 out of 10 periumbilical and right upper quadrant discomfort with radiation to right flank and one episode of associated vomiting.  She was unable to sleep due to her discomfort.  She denies any recent illness, fevers, chills, dizziness, headache, shortness of breath, loose stools, dysuria, increased urinary frequency, or vaginal discharge.  She has not taken any for her symptoms.  This morning she went to an urgent care for her discomfort and was given 60 mg Toradol IM and 8 mg Zofran for her ongoing nausea.  The instructed her to return to the ED with concerns that she has peritonitis based on their physical exam.     HPI  Past Medical History:  Diagnosis Date  . Obesity   . S/P cesarean section 03/30/2018  . Urinary tract infection     Patient Active Problem List   Diagnosis Date Noted  . S/P cesarean section 03/30/2018  . Preterm premature rupture of membranes (PPROM) with unknown onset of labor 03/29/2018  . Morbid obesity (HCC) 06/08/2016    Past Surgical History:  Procedure Laterality Date  . CESAREAN SECTION MULTI-GESTATIONAL N/A 03/30/2018   Procedure: CESAREAN SECTION MULTI-GESTATIONAL;  Surgeon: Hermina Staggers, MD;  Location: [facility] BIRTHING SUITES;  Service: Obstetrics;  Laterality: N/A;  . 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 Authorizing Provider  cephALEXin (KEFLEX) 500 MG capsule Take 1 capsule (500 mg total) by mouth 4 (four) times daily for 7 days. 01/05/19 01/12/19  Lorelee New, PA-C  iron polysaccharides (NIFEREX) 150 MG capsule Take 1 capsule (150 mg total) by mouth daily. 04/03/18   Huel Cote, MD  oxyCODONE-acetaminophen (PERCOCET/ROXICET) 5-325 MG tablet Take 2 tablets by mouth every 4 (four) hours as needed for severe pain ((when tolerating fluids)). 04/02/18   Huel Cote, MD  Prenatal Vit-Fe Fumarate-FA (PRENATAL MULTIVITAMIN) TABS tablet Take 1 tablet by mouth at bedtime.    [provider]    Family History Family History  Problem Relation Age of Onset  . Hypertension Father   . Diabetes Father   . Hypertension Maternal Grandmother     Social History Social History   Tobacco Use  . Smoking status: Former Smoker    Types: Cigarettes    Quit date: 07/22/2017  Years since quitting: 1.4  . Smokeless tobacco: Never Used  Substance Use Topics  . Alcohol use: Not Currently    Alcohol/week: 0.0 standard drinks  . Drug use: Never     Allergies   Penicillins   Review of Systems Review of Systems  All other systems reviewed and are negative.    Physical Exam Updated Vital Signs BP 128/72 (BP Location: Right Arm)   Pulse (!) 54   Temp 99 F (37.2 C) (Oral)   Resp 16   LMP 12/29/2018   SpO2 100%   Physical Exam Vitals signs and nursing note reviewed. Exam conducted with a chaperone present.  Constitutional:      Appearance: Normal appearance.  HENT:     Head: Normocephalic and atraumatic.  Eyes:     General: No scleral icterus.     Conjunctiva/sclera: Conjunctivae normal.  Pulmonary:     Effort: Pulmonary effort is normal.  Abdominal:     Comments: Soft, nondistended.  Mild tenderness to palpation diffusely, but most notably in the epigastric region.  Questionable Murphy's sign.  No guarding.  No overlying erythema.  Skin:    General: Skin is dry.  Neurological:     Mental Status: She is alert.     GCS: GCS eye subscore is 4. GCS verbal subscore is 5. GCS motor subscore is 6.  Psychiatric:        Mood and Affect: Mood normal.        Behavior: Behavior normal.        Thought Content: Thought content normal.      ED Treatments / Results  Labs (all labs ordered are listed, but only abnormal results are displayed) Labs Reviewed  COMPREHENSIVE METABOLIC PANEL - Abnormal; Notable for the following components:      Result Value   CO2 21 (*)    Glucose, Bld 111 (*)    Calcium 8.4 (*)    All other components within normal limits  CBC - Abnormal; Notable for the following components:   Hemoglobin 9.6 (*)    HCT 32.8 (*)    MCV 75.8 (*)    MCH 22.2 (*)    MCHC 29.3 (*)    RDW 17.0 (*)    All other components within normal limits  URINALYSIS, ROUTINE W REFLEX MICROSCOPIC - Abnormal; Notable for the following components:   APPearance HAZY (*)    Ketones, ur 5 (*)    Nitrite POSITIVE (*)    Bacteria, UA MANY (*)    All other components within normal limits  URINE CULTURE  LIPASE, BLOOD  I-STAT BETA HCG BLOOD, ED ([facility], [facility], [facility] ONLY)    EKG None  Radiology Ct Abdomen Pelvis W Contrast  Result Date: 01/05/2019 CLINICAL DATA:  Right lower quadrant and flank pain for 1 month. Nausea and vomiting. Suspected appendicitis. EXAM: CT ABDOMEN AND PELVIS WITH CONTRAST TECHNIQUE: Multidetector CT imaging of the abdomen and pelvis was performed using the standard protocol following bolus administration of intravenous contrast. CONTRAST:  129mL OMNIPAQUE IOHEXOL 300 MG/ML  SOLN COMPARISON:  None. FINDINGS: Lower Chest:  No acute findings. Hepatobiliary: No hepatic masses identified. Gallbladder is unremarkable. No evidence of biliary ductal dilatation. Pancreas:  No mass or inflammatory changes. Spleen: Within normal limits in size and appearance. Adrenals/Urinary Tract: No masses identified. No evidence of hydronephrosis. Stomach/Bowel: Previous gastric bypass surgery noted. No evidence of obstruction, inflammatory process or abnormal fluid collections. Normal appendix visualized. Vascular/Lymphatic: No pathologically enlarged lymph nodes. No abdominal aortic aneurysm.  Reproductive: No masses identified. Tiny amount of free fluid in pelvis is most likely physiologic in a reproductive age female. Other:  None. Musculoskeletal:  No suspicious bone lesions identified. IMPRESSION: Tiny amount of free fluid in pelvis, which is most likely physiologic in a reproductive age female. No evidence of appendicitis or other acute findings. Electronically Signed   By: Danae OrleansJohn A Stahl M.D.   On: 01/05/2019 15:14    Procedures Procedures (including critical care time)  Medications Ordered in ED Medications  sodium chloride flush (NS) 0.9 % injection 3 mL (3 mLs Intravenous Given 01/05/19 1259)  iohexol (OMNIPAQUE) 300 MG/ML solution 100 mL (100 mLs Intravenous Contrast Given 01/05/19 1409)     Initial Impression / Assessment and Plan / ED Course  I have reviewed the triage vital signs and the nursing notes.  Pertinent labs & imaging results that were available during my care of the patient were reviewed by me and considered in my medical decision making (see chart for details).       CBC was interpreted and demonstrates anemia, but it appears to be her baseline and she is asymptomatic.  Her other labs were also reviewed and are unremarkable.  She is hemodynamically stable and her vital signs are all normal.  Obtained CT abdomen pelvis with contrast given her atypical abdominal complaints and history of bariatric surgery in  conjunction with concerns for possible peritonitis.  Imaging was reviewed and does not demonstrate any evidence of appendicitis or acute intra-abdominal pathology.  While patient is afebrile with no reports of dysuria or increased efficacy, patient is nitrite positive and presenting with right flank pain and positive CVA tenderness.  Urinary tract infection could certainly explain her complaints of abdominal discomfort.  Will treat with Keflex x7 days.  Recommend that she take Tylenol or ibuprofen should she develop any fever chills.  All of the evaluation and work-up results were discussed with the patient and any family at bedside. They were provided opportunity to ask any additional questions and have none at this time. They have expressed understanding of verbal discharge instructions as well as return precautions and are agreeable to the plan.    Final Clinical Impressions(s) / ED Diagnoses   Final diagnoses:  Right flank pain    ED Discharge Orders         Ordered    cephALEXin (KEFLEX) 500 MG capsule  4 times daily     01/05/19 1539           Lorelee NewGreen, Lynzi Meulemans L, PA-C 01/05/19 1545    Alvira MondaySchlossman, Erin, MD 01/06/19 [phone]

## 2019-01-05 NOTE — ED Triage Notes (Signed)
C/o sharp mid upper abd/ epigastric pain that radiates to back intermittently x 1 1/2 months.  Nausea tonight.  Denies urinary complaints.

## 2019-01-05 NOTE — ED Notes (Signed)
Pt called x3 for vitals recheck no answer.

## 2019-01-05 NOTE — Discharge Instructions (Signed)
Recommend he get established with a PCP for ongoing evaluation management.  Please see referral.  Please take the antibiotics, as prescribed.  Please discontinue immediately if you develop any significant side effects as a result of Keflex and return to ED should you develop any facial swelling, shortness of breath, or difficulty swallowing.  Also return to the ED should he develop any uncontrollable fever chills, inability to tolerate food or liquid by mouth, uncontrolled nausea vomiting, worsening abdominal pain, or any other new or worsening symptoms.

## 2019-01-05 NOTE — ED Triage Notes (Signed)
Pt reports RUQ pain that radiates to her R flank x1 month intermittently. Endorses n/v denies diarrhea. Seen at urgent care and sent here for concerns of gallbladder/ urinary infection (UA at [facility] +nitrites/ketones). Pt here this am but states she lwbs due to long wait time.

## 2019-01-08 LAB — URINE CULTURE: Culture: >=100000 — AB

## 2019-01-09 ENCOUNTER — Telehealth: Payer: Self-pay | Admitting: *Deleted

## 2019-01-09 NOTE — Telephone Encounter (Signed)
Post ED Visit - Positive Culture Follow-up  Culture report reviewed by antimicrobial stewardship pharmacist: [place] Team []  Elenor Quinones, Pharm.D. []  Heide Guile, Pharm.D., BCPS AQ-ID []  Parks Neptune, Pharm.D., BCPS []  Alycia Rossetti, Pharm.D., BCPS []  [place], [place].D., BCPS, AAHIVP []  Legrand Como, Pharm.D., BCPS, AAHIVP []  Salome Arnt, PharmD, BCPS []  Johnnette Gourd, PharmD, BCPS []  Hughes Better, PharmD, BCPS []  Leeroy Cha, PharmD []  Laqueta Linden, PharmD, BCPS []  Albertina Parr, PharmD  [place] Team []  Leodis Sias, PharmD []  Lindell Spar, PharmD []  Royetta Asal, PharmD []  Graylin Shiver, Rph []  Rema Fendt) Glennon Mac, PharmD []  Arlyn Dunning, PharmD []  Netta Cedars, PharmD []  Dia Sitter, PharmD []  Leone Haven, PharmD []  Gretta Arab, PharmD []  Theodis Shove, PharmD []  Peggyann Juba, PharmD []  Reuel Boom, PharmD   Positive urine culture, Duanne Limerick, PharmD  Treated with Cephalexin, organism sensitive to the same and no further patient follow-up is required at this time.  Harlon Flor [facility] 01/09/2019, 10:57 AM

## 2019-03-27 ENCOUNTER — Emergency Department (HOSPITAL_COMMUNITY): Payer: BC Managed Care – PPO

## 2019-03-27 ENCOUNTER — Other Ambulatory Visit: Payer: Self-pay

## 2019-03-27 ENCOUNTER — Encounter (HOSPITAL_COMMUNITY): Payer: Self-pay | Admitting: *Deleted

## 2019-03-27 ENCOUNTER — Emergency Department (HOSPITAL_COMMUNITY)
Admission: EM | Admit: 2019-03-27 | Discharge: 2019-03-27 | Disposition: A | Discharge status: Home / Self Care | Payer: BC Managed Care – PPO | Attending: Emergency Medicine | Admitting: Emergency Medicine

## 2019-03-27 DIAGNOSIS — K829 Disease of gallbladder, unspecified: Secondary | ICD-10-CM | POA: Insufficient documentation

## 2019-03-27 DIAGNOSIS — R109 Unspecified abdominal pain: Secondary | ICD-10-CM | POA: Diagnosis present

## 2019-03-27 DIAGNOSIS — R1011 Right upper quadrant pain: Secondary | ICD-10-CM | POA: Diagnosis not present

## 2019-03-27 DIAGNOSIS — Z87891 Personal history of nicotine dependence: Secondary | ICD-10-CM | POA: Diagnosis not present

## 2019-03-27 LAB — COMPREHENSIVE METABOLIC PANEL
ALT: 69 U/L — ABNORMAL HIGH (ref 0–44)
AST: 132 U/L — ABNORMAL HIGH (ref 15–41)
Albumin: 3.9 g/dL (ref 3.5–5.0)
Alkaline Phosphatase: 120 U/L (ref 38–126)
Anion gap: 7 (ref 5–15)
BUN: 13 mg/dL (ref 6–20)
CO2: 23 mmol/L (ref 22–32)
Calcium: 8.7 mg/dL — ABNORMAL LOW (ref 8.9–10.3)
Chloride: 110 mmol/L (ref 98–111)
Creatinine, Ser: 0.57 mg/dL (ref 0.44–1.00)
GFR calc Af Amer: >60 mL/min (ref >60)
GFR calc non Af Amer: >60 mL/min (ref >60)
Glucose, Bld: 93 mg/dL (ref 70–99)
Potassium: 3.9 mmol/L (ref 3.5–5.1)
Sodium: 140 mmol/L (ref 135–145)
Total Bilirubin: 0.9 mg/dL (ref 0.3–1.2)
Total Protein: 7.2 g/dL (ref 6.5–8.1)

## 2019-03-27 LAB — CBC WITH DIFFERENTIAL/PLATELET
Abs Immature Granulocytes: 0.02 10*3/uL (ref 0.00–0.07)
Basophils Absolute: 0 10*3/uL (ref 0.0–0.1)
Basophils Relative: 0 %
Eosinophils Absolute: 0.1 10*3/uL (ref 0.0–0.5)
Eosinophils Relative: 1 %
HCT: 32.6 % — ABNORMAL LOW (ref 36.0–46.0)
Hemoglobin: 9.6 g/dL — ABNORMAL LOW (ref 12.0–15.0)
Immature Granulocytes: 0 %
Lymphocytes Relative: 29 %
Lymphs Abs: 2.2 10*3/uL (ref 0.7–4.0)
MCH: 22.3 pg — ABNORMAL LOW (ref 26.0–34.0)
MCHC: 29.4 g/dL — ABNORMAL LOW (ref 30.0–36.0)
MCV: 75.8 fL — ABNORMAL LOW (ref 80.0–100.0)
Monocytes Absolute: 0.5 10*3/uL (ref 0.1–1.0)
Monocytes Relative: 7 %
Neutro Abs: 4.8 10*3/uL (ref 1.7–7.7)
Neutrophils Relative %: 63 %
Platelets: 199 10*3/uL (ref 150–400)
RBC: 4.3 MIL/uL (ref 3.87–5.11)
RDW: 18 % — ABNORMAL HIGH (ref 11.5–15.5)
WBC: 7.7 10*3/uL (ref 4.0–10.5)
nRBC: 0 % (ref 0.0–0.2)

## 2019-03-27 LAB — I-STAT BETA HCG BLOOD, ED (MC, WL, AP ONLY): I-stat hCG, quantitative: <5 m[IU]/mL (ref <5)

## 2019-03-27 LAB — LIPASE, BLOOD: Lipase: 21 U/L (ref 11–51)

## 2019-03-27 MED ORDER — IOHEXOL 300 MG/ML  SOLN
100.0000 mL | Freq: Once | INTRAMUSCULAR | Status: AC | PRN
  Administered 2019-03-27: 16:00:00 100 mL via INTRAVENOUS

## 2019-03-27 MED ORDER — MORPHINE SULFATE (PF) 4 MG/ML IV SOLN
6.0000 mg | Freq: Once | INTRAVENOUS | Status: AC
  Administered 2019-03-27: 6 mg via INTRAVENOUS
  Filled 2019-03-27: qty 2

## 2019-03-27 MED ORDER — SODIUM CHLORIDE 0.9 % IV SOLN
INTRAVENOUS | Status: DC
  Administered 2019-03-27: 15:00:00 125 mL/h via INTRAVENOUS

## 2019-03-27 MED ORDER — METOCLOPRAMIDE HCL 5 MG/ML IJ SOLN
5.0000 mg | Freq: Once | INTRAMUSCULAR | Status: AC
  Administered 2019-03-27: 5 mg via INTRAVENOUS
  Filled 2019-03-27: qty 2

## 2019-03-27 MED ORDER — HYDROCODONE-ACETAMINOPHEN 5-325 MG PO TABS: 1.0000 | ORAL_TABLET | ORAL | 0 refills | Status: DC | PRN

## 2019-03-27 MED ORDER — SODIUM CHLORIDE (PF) 0.9 % IJ SOLN
INTRAMUSCULAR | Status: AC
  Filled 2019-03-27: qty 50

## 2019-03-27 NOTE — ED Provider Notes (Signed)
[facility] DEPT Provider Note   CSN: [id number] Arrival date & time: 03/27/19  6789     History Chief Complaint  Patient presents with  . Abdominal Pain  . Flank Pain    Wendy Mcfarland is a 24 y.o. female.  24 year old female presents with right upper quadrant abdominal pain times several days.  Pain seems to come in waves and has been associate with nausea but no vomiting.  No fever or chills.  No vaginal bleeding or discharge.  Patient is status post duodenal switch.  Denies any urinary symptoms.  Nothing makes her symptoms better or worse no treatment use prior to arrival.        Past Medical History:  Diagnosis Date  . Obesity   . S/P cesarean section 03/30/2018  . Urinary tract infection     Patient Active Problem List   Diagnosis Date Noted  . S/P cesarean section 03/30/2018  . Preterm premature rupture of membranes (PPROM) with unknown onset of labor 03/29/2018  . Morbid obesity ([place]) 06/08/2016    Past Surgical History:  Procedure Laterality Date  . CESAREAN SECTION MULTI-GESTATIONAL N/A 03/30/2018   Procedure: CESAREAN SECTION MULTI-GESTATIONAL;  Surgeon: Chancy Milroy, MD;  Location: [place];  Service: Obstetrics;  Laterality: N/A;  . LAPAROSCOPIC GASTRIC RESTRICTIVE DUODENAL PROCEDURE (DUODENAL SWITCH)       OB History    Gravida  1   Para  1   Term      Preterm  1   AB      Living  2     SAB      TAB      Ectopic      Multiple  1   Live Births  2           Family History  Problem Relation Age of Onset  . Hypertension Father   . Diabetes Father   . Hypertension Maternal Grandmother     Social History   Tobacco Use  . Smoking status: Former Smoker    Types: Cigarettes    Quit date: 07/22/2017    Years since quitting: 1.6  . Smokeless tobacco: Never Used  Substance Use Topics  . Alcohol use: Not Currently    Alcohol/week: 0.0 standard drinks  . Drug use: Never    Home  Medications Prior to Admission medications   Medication Sig Start Date End Date Taking? Authorizing Provider  iron polysaccharides (NIFEREX) 150 MG capsule Take 1 capsule (150 mg total) by mouth daily. 04/03/18   Paula Compton, MD  oxyCODONE-acetaminophen (PERCOCET/ROXICET) 5-325 MG tablet Take 2 tablets by mouth every 4 (four) hours as needed for severe pain ((when tolerating fluids)). 04/02/18   Paula Compton, MD  Prenatal Vit-Fe Fumarate-FA (PRENATAL MULTIVITAMIN) TABS tablet Take 1 tablet by mouth at bedtime.    [provider]    Allergies    Penicillins  Review of Systems   Review of Systems  All other systems reviewed and are negative.   Physical Exam Updated Vital Signs BP 117/67 (BP Location: Right Arm)   Pulse (!) 54   Temp 98.6 F (37 C) (Oral)   Resp 18   Ht 1.829 m (6')   Wt 90.7 kg   SpO2 100%   BMI 27.12 kg/m   Physical Exam Vitals and nursing note reviewed.  Constitutional:      General: She is not in acute distress.    Appearance: Normal appearance. She is well-developed. She is not toxic-appearing.  HENT:     Head: Normocephalic and atraumatic.  Eyes:     General: Lids are normal.     Conjunctiva/sclera: Conjunctivae normal.     Pupils: Pupils are equal, round, and reactive to light.  Neck:     Thyroid: No thyroid mass.     Trachea: No tracheal deviation.  Cardiovascular:     Rate and Rhythm: Normal rate and regular rhythm.     Heart sounds: Normal heart sounds. No murmur. No gallop.   Pulmonary:     Effort: Pulmonary effort is normal. No respiratory distress.     Breath sounds: Normal breath sounds. No stridor. No decreased breath sounds, wheezing, rhonchi or rales.  Abdominal:     General: Bowel sounds are normal. There is no distension.     Palpations: Abdomen is soft.     Tenderness: There is abdominal tenderness in the right upper quadrant. There is no rebound.    Musculoskeletal:        General: No tenderness. Normal range  of motion.     Cervical back: Normal range of motion and neck supple.  Skin:    General: Skin is warm and dry.     Findings: No abrasion or rash.  Neurological:     Mental Status: She is alert and oriented to person, place, and time.     GCS: GCS eye subscore is 4. GCS verbal subscore is 5. GCS motor subscore is 6.     Cranial Nerves: No cranial nerve deficit.     Sensory: No sensory deficit.  Psychiatric:        Speech: Speech normal.        Behavior: Behavior normal.     ED Results / Procedures / Treatments   Labs (all labs ordered are listed, but only abnormal results are displayed) Labs Reviewed  URINALYSIS, ROUTINE W REFLEX MICROSCOPIC  CBC WITH DIFFERENTIAL/PLATELET  COMPREHENSIVE METABOLIC PANEL  LIPASE, BLOOD  I-STAT BETA HCG BLOOD, ED ([facility], [facility], [facility] ONLY)    EKG None  Radiology No results found.  Procedures Procedures (including critical care time)  Medications Ordered in ED Medications  0.9 %  sodium chloride infusion (has no administration in time range)  morphine 4 MG/ML injection 6 mg (has no administration in time range)  metoCLOPramide (REGLAN) injection 5 mg (has no administration in time range)    ED Course  I have reviewed the triage vital signs and the nursing notes.  Pertinent labs & imaging results that were available during my care of the patient were reviewed by me and considered in my medical decision making (see chart for details).    MDM Rules/Calculators/A&P                     3:42 PM  Patient medicated for pain here and had ultrasound which does show some gallbladder sludge.  Will order abdominal CT.  Labs are pending at this time.  5:03 PM Patient with abdominal CT which denies any signs of obstruction.  Will give referral to general surgery Final Clinical Impression(s) / ED Diagnoses Final diagnoses:  None    Rx / DC Orders ED Discharge Orders    None       Lorre Nick, MD 03/27/19 1703

## 2019-03-27 NOTE — ED Triage Notes (Signed)
PTAR reports abd and flank pain started last night, No N/V/D. 130/88-69-18-98% RA

## 2019-07-07 ENCOUNTER — Ambulatory Visit: Payer: BC Managed Care – PPO

## 2019-08-23 ENCOUNTER — Institutional Professional Consult (permissible substitution): Payer: BC Managed Care – PPO | Admitting: Plastic Surgery

## 2019-10-24 ENCOUNTER — Institutional Professional Consult (permissible substitution): Payer: BC Managed Care – PPO | Admitting: Plastic Surgery

## 2020-07-14 ENCOUNTER — Ambulatory Visit
Admission: EM | Admit: 2020-07-14 | Discharge: 2020-07-14 | Disposition: A | Discharge status: Home / Self Care | Payer: BC Managed Care – PPO | Attending: Emergency Medicine | Admitting: Emergency Medicine

## 2020-07-14 ENCOUNTER — Other Ambulatory Visit: Payer: Self-pay

## 2020-07-14 DIAGNOSIS — R1033 Periumbilical pain: Secondary | ICD-10-CM

## 2020-07-14 DIAGNOSIS — K625 Hemorrhage of anus and rectum: Secondary | ICD-10-CM

## 2020-07-14 MED ORDER — HYDROCORTISONE ACETATE 25 MG RE SUPP: 25.0000 mg | Freq: Two times a day (BID) | RECTAL | 0 refills | Status: AC

## 2020-07-14 NOTE — ED Triage Notes (Signed)
Pt c/o rectal bleeding since Saturday. States BRB when having a bowel movement and passed a blood clot while in the shower. Pt c/o center abdominal pain.

## 2020-07-14 NOTE — Discharge Instructions (Signed)
Please use Anusol suppositories twice daily to help with any internal hemorrhoids, bleeding Please follow-up with gastroenterology for further evaluation of bleeding Drink plenty of water, increase fiber in the diet and may use stool softener to help with passing of stool If you are developing worsening abdominal pain, dizziness lightheadedness please go to emergency room for further evaluation

## 2020-07-14 NOTE — ED Provider Notes (Signed)
[name]    CSN: [id number] Arrival date & time: 07/14/20  0803      History   Chief Complaint Chief Complaint  Patient presents with  . Rectal Bleeding    HPI Wendy Mcfarland is a 25 y.o. female presenting today for evaluation of rectal bleeding.  Reports over the past 3 days she has had rectal bleeding.  Initially started with passing a bowel movement and noticed blood in the stool.  Blood has been bright red blood.  She has also had associated abdominal pain.  Reports over the past 2 days has noticed some more blood in stool, passing clots.   Denies any rectal pain.  Denies history of hemorrhoids.  Has had prior cholecystectomy.  Has had decreased appetite with mild nausea, but denies vomiting.  Denies worsening of pain with oral intake.  She is unsure if blood is mixed in stool or separate.  Denies any dizziness or lightheadedness.  Denies tobacco use, significant alcohol use.  Denies use of NSAIDs as she has also had prior gastric surgery.  HPI  Past Medical History:  Diagnosis Date  . Obesity   . S/P cesarean section 03/30/2018  . Urinary tract infection     Patient Active Problem List   Diagnosis Date Noted  . S/P cesarean section 03/30/2018  . Preterm premature rupture of membranes (PPROM) with unknown onset of labor 03/29/2018  . Morbid obesity (HCC) 06/08/2016    Past Surgical History:  Procedure Laterality Date  . CESAREAN SECTION MULTI-GESTATIONAL N/A 03/30/2018   Procedure: CESAREAN SECTION MULTI-GESTATIONAL;  Surgeon: Hermina Staggers, MD;  Location: [facility] BIRTHING SUITES;  Service: Obstetrics;  Laterality: N/A;  . 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 Authorizing Provider  hydrocortisone (ANUSOL-HC) 25 MG suppository Place 1 suppository (25 mg total) rectally 2 (two) times daily. 07/14/20  Yes Kerina Simoneau C, PA-C  Multiple Vitamins-Calcium (ONE-A-DAY WOMENS FORMULA PO) Take 1 tablet by mouth daily.    [provider]    Family History Family History  Problem Relation Age of Onset  . Hypertension Father   . Diabetes Father   . Hypertension Maternal Grandmother     Social History Social History   Tobacco Use  . Smoking status: Former Smoker    Types: Cigarettes    Quit date: 07/22/2017    Years since quitting: 2.9  . Smokeless tobacco: Never Used  Vaping Use  . Vaping Use: Never used  Substance Use Topics  . Alcohol use: Yes    Alcohol/week: 0.0 standard drinks  . Drug use: Never     Allergies   Penicillins   Review of Systems Review of Systems  Constitutional: Negative for fatigue and fever.  HENT: Negative for mouth sores.   Eyes: Negative for visual disturbance.  Respiratory: Negative for shortness of breath.   Cardiovascular: Negative for chest pain.  Gastrointestinal: Positive for abdominal pain and anal bleeding.  Negative for nausea and vomiting.  Genitourinary: Negative for genital sores.  Musculoskeletal: Negative for arthralgias and joint swelling.  Skin: Negative for color change, rash and wound.  Neurological: Negative for dizziness, weakness, light-headedness and headaches.     Physical Exam Triage Vital Signs ED Triage Vitals  Enc Vitals Group     BP      Pulse      Resp      Temp      Temp src      SpO2      Weight      Height      Head Circumference      Peak Flow      Pain Score      Pain Loc      Pain Edu?      Excl. in GC?    No data found.  Updated Vital Signs BP (!) 147/85 (BP Location: Left Arm)   Pulse 83   Temp 98.3 F (36.8 C) (Oral)   Resp 18   LMP 07/06/2020   SpO2 100%   Breastfeeding No   Visual Acuity Right Eye Distance:   Left Eye  Distance:   Bilateral Distance:    Right Eye Near:   Left Eye Near:    Bilateral Near:     Physical Exam Vitals and nursing note reviewed.  Constitutional:      Appearance: She is well-developed.     Comments: Sitting comfortably in chair, no acute distress  HENT:     Head: Normocephalic and atraumatic.     Nose: Nose normal.  Eyes:     Conjunctiva/sclera: Conjunctivae normal.  Cardiovascular:     Rate and Rhythm: Normal rate.  Pulmonary:     Effort: Pulmonary effort is normal. No respiratory distress.  Abdominal:     General: There is no distension.     Comments: Soft, nondistended, tenderness to palpation diffusely throughout abdomen, more focal to mid upper abdomen, negative rebound, negative Rovsing  Genitourinary:    Comments: No external hemorrhoids erythema or induration, nontender to palpation of rectum externally, discomfort felt with DRE, no palpable masses, slight stool with very faint erythema color noted on glove Musculoskeletal:        General: Normal range of motion.     Cervical back: Neck supple.  Skin:    General: Skin is warm and dry.  Neurological:     Mental Status: She is alert and oriented to person, place, and time.      UC Treatments / Results  Labs (all labs ordered are listed, but only abnormal results are displayed) Labs Reviewed - No data to display  EKG   Radiology No results found.  Procedures Procedures (including critical care time)  Medications Ordered in UC Medications - No data to display  Initial Impression / Assessment and Plan / UC Course  I have reviewed the triage vital signs and the nursing notes.  Pertinent labs & imaging results that were available during my care of the patient were reviewed by me and considered in my medical decision making (see chart for details).     Rectal bleeding-possible internal hemorrhoids, treating with Anusol, discussed concern regarding associated abdominal pain with this, as this  typically is not seen with hemorrhoids, encouraged to follow-up in emergency room if having worsening abdominal pain for further imaging.  Encouraged plenty of fluids water fiber and stool softeners to help with any underlying constipation.  Patient stable at this time, placing referral to  gastroenterology as well for rectal bleeding.  Discussed strict return precautions. Patient verbalized understanding and is agreeable with plan.  Final Clinical Impressions(s) / UC Diagnoses   Final diagnoses:  Rectal bleeding  Periumbilical abdominal pain     Discharge Instructions     Please use Anusol suppositories twice daily to help with any internal hemorrhoids, bleeding Please follow-up with gastroenterology for further evaluation of bleeding Drink plenty of water, increase fiber in the diet and may use stool softener to help with passing of stool If you are developing worsening abdominal pain, dizziness lightheadedness please go to emergency room for further evaluation    ED Prescriptions    Medication Sig Dispense Auth. Provider   hydrocortisone (ANUSOL-HC) 25 MG suppository Place 1 suppository (25 mg total) rectally 2 (two) times daily. 12 suppository Wister Hoefle, [place] C, PA-C     PDMP not reviewed this encounter.   Lew Dawes, [place] 07/14/20 [phone]

## 2020-07-15 ENCOUNTER — Encounter (HOSPITAL_COMMUNITY): Payer: Self-pay | Admitting: *Deleted

## 2020-07-15 ENCOUNTER — Emergency Department (HOSPITAL_COMMUNITY): Payer: BC Managed Care – PPO

## 2020-07-15 ENCOUNTER — Emergency Department (HOSPITAL_COMMUNITY)
Admission: EM | Admit: 2020-07-15 | Discharge: 2020-07-15 | Disposition: A | Discharge status: Home / Self Care | Payer: BC Managed Care – PPO | Attending: Emergency Medicine | Admitting: Emergency Medicine

## 2020-07-15 DIAGNOSIS — K922 Gastrointestinal hemorrhage, unspecified: Secondary | ICD-10-CM | POA: Insufficient documentation

## 2020-07-15 DIAGNOSIS — R1084 Generalized abdominal pain: Secondary | ICD-10-CM

## 2020-07-15 DIAGNOSIS — Z87891 Personal history of nicotine dependence: Secondary | ICD-10-CM | POA: Diagnosis not present

## 2020-07-15 DIAGNOSIS — K6289 Other specified diseases of anus and rectum: Secondary | ICD-10-CM | POA: Insufficient documentation

## 2020-07-15 DIAGNOSIS — K625 Hemorrhage of anus and rectum: Secondary | ICD-10-CM | POA: Diagnosis present

## 2020-07-15 LAB — CBC
HCT: 30.8 % — ABNORMAL LOW (ref 36.0–46.0)
Hemoglobin: 8.7 g/dL — ABNORMAL LOW (ref 12.0–15.0)
MCH: 19.7 pg — ABNORMAL LOW (ref 26.0–34.0)
MCHC: 28.2 g/dL — ABNORMAL LOW (ref 30.0–36.0)
MCV: 69.7 fL — ABNORMAL LOW (ref 80.0–100.0)
Platelets: 243 10*3/uL (ref 150–400)
RBC: 4.42 MIL/uL (ref 3.87–5.11)
RDW: 18.7 % — ABNORMAL HIGH (ref 11.5–15.5)
WBC: 3.8 10*3/uL — ABNORMAL LOW (ref 4.0–10.5)
nRBC: 0 % (ref 0.0–0.2)

## 2020-07-15 LAB — COMPREHENSIVE METABOLIC PANEL
ALT: 20 U/L (ref 0–44)
AST: 27 U/L (ref 15–41)
Albumin: 3.7 g/dL (ref 3.5–5.0)
Alkaline Phosphatase: 104 U/L (ref 38–126)
Anion gap: 7 (ref 5–15)
BUN: 9 mg/dL (ref 6–20)
CO2: 23 mmol/L (ref 22–32)
Calcium: 8.7 mg/dL — ABNORMAL LOW (ref 8.9–10.3)
Chloride: 111 mmol/L (ref 98–111)
Creatinine, Ser: 0.5 mg/dL (ref 0.44–1.00)
GFR, Estimated: >60 mL/min (ref >60)
Glucose, Bld: 103 mg/dL — ABNORMAL HIGH (ref 70–99)
Potassium: 3.7 mmol/L (ref 3.5–5.1)
Sodium: 141 mmol/L (ref 135–145)
Total Bilirubin: 0.9 mg/dL (ref 0.3–1.2)
Total Protein: 7.6 g/dL (ref 6.5–8.1)

## 2020-07-15 LAB — LIPASE, BLOOD: Lipase: 25 U/L (ref 11–51)

## 2020-07-15 LAB — I-STAT BETA HCG BLOOD, ED (MC, WL, AP ONLY): I-stat hCG, quantitative: <5 m[IU]/mL (ref <5)

## 2020-07-15 MED ORDER — METRONIDAZOLE 500 MG PO TABS
500.0000 mg | ORAL_TABLET | Freq: Once | ORAL | Status: AC
  Administered 2020-07-15: 500 mg via ORAL
  Filled 2020-07-15: qty 1

## 2020-07-15 MED ORDER — CIPROFLOXACIN HCL 500 MG PO TABS
500.0000 mg | ORAL_TABLET | Freq: Once | ORAL | Status: AC
  Administered 2020-07-15: 500 mg via ORAL
  Filled 2020-07-15: qty 1

## 2020-07-15 MED ORDER — CIPROFLOXACIN HCL 500 MG PO TABS: 500.0000 mg | ORAL_TABLET | Freq: Two times a day (BID) | ORAL | 0 refills | Status: DC

## 2020-07-15 MED ORDER — METRONIDAZOLE 500 MG PO TABS: 500.0000 mg | ORAL_TABLET | Freq: Two times a day (BID) | ORAL | 0 refills | Status: DC

## 2020-07-15 MED ORDER — DICYCLOMINE HCL 10 MG PO CAPS
20.0000 mg | ORAL_CAPSULE | Freq: Once | ORAL | Status: AC
  Administered 2020-07-15: 20 mg via ORAL
  Filled 2020-07-15: qty 2

## 2020-07-15 MED ORDER — ONDANSETRON HCL 4 MG/2ML IJ SOLN
4.0000 mg | Freq: Once | INTRAMUSCULAR | Status: AC
  Administered 2020-07-15: 4 mg via INTRAVENOUS
  Filled 2020-07-15: qty 2

## 2020-07-15 MED ORDER — DICYCLOMINE HCL 20 MG PO TABS: 20.0000 mg | ORAL_TABLET | Freq: Two times a day (BID) | ORAL | 0 refills | Status: AC

## 2020-07-15 MED ORDER — SODIUM CHLORIDE 0.9 % IV BOLUS
1000.0000 mL | Freq: Once | INTRAVENOUS | Status: AC
  Administered 2020-07-15: 1000 mL via INTRAVENOUS

## 2020-07-15 MED ORDER — ONDANSETRON 4 MG PO TBDP: ORAL_TABLET | ORAL | 0 refills | Status: AC

## 2020-07-15 MED ORDER — IOHEXOL 300 MG/ML  SOLN
100.0000 mL | Freq: Once | INTRAMUSCULAR | Status: AC | PRN
  Administered 2020-07-15: 100 mL via INTRAVENOUS

## 2020-07-15 NOTE — ED Provider Notes (Signed)
[facility]-EMERGENCY DEPT Provider Note   CSN: [id number] Arrival date & time: 07/15/20  2683     History Chief Complaint  Patient presents with  . Abdominal Pain  . Rectal Bleeding    Wendy Mcfarland is a 25 y.o. female.  Wendy Mcfarland is a 25 y.o. female who presents with abdominal pain and rectal bleeding. Patient states she first noticed blood in her stool on Saturday but there was no abdominal pain. The blood is present in the toilet and on toilet paper. She reports she then woke up Sunday and began to experience periumbilical pain, that does not radiate. The pain is constant but is mild at times with spasms of severe pain where she stated she is unable to move due to the pain. Patient continued to experience blood with her bowel movements in addition to the pain and noticed a blood clot pass in the shower Sunday evening. Patient has never experienced anything similar in the past. Additionally she did note she is not experiencing vaginal bleeding, as she checked for vaginal bleeding to confirm the blood was from her rectum before visiting the ED. She denies any tearing or ripping pain with bowel movements or having to strain to pass stool. Denies any rectal pain. She denies any recent changes to her bowel movements consistency or frequency. She additionally denies any fever, chills, chest pain, shortness of breath. She has no history of recent travel, new foods, or sick contacts. 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  Patient was evaluated at urgent care yesterday, had bright red blood on rectal exam, they thought this was potentially due to hemorrhoids but encouraged her to follow-up in ED if pain worsened.  Patient is not on any blood thinners.  No known family history of IBD.        Past Medical History:  Diagnosis Date  . Obesity   . S/P cesarean section 03/30/2018  .  Urinary tract infection     Patient Active Problem List   Diagnosis Date Noted  . S/P cesarean section 03/30/2018  . Preterm premature rupture of membranes (PPROM) with unknown onset of labor 03/29/2018  . Morbid obesity (HCC) 06/08/2016    Past Surgical History:  Procedure Laterality Date  . CESAREAN SECTION MULTI-GESTATIONAL N/A 03/30/2018   Procedure: CESAREAN SECTION MULTI-GESTATIONAL;  Surgeon: Hermina Staggers, MD;  Location: [facility] BIRTHING SUITES;  Service: Obstetrics;  Laterality: N/A;  . 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 Hypertension Father   . Diabetes Father   . Hypertension Maternal Grandmother     Social History   Tobacco Use  . Smoking status: Former Smoker    Types: Cigarettes    Quit date: 07/22/2017    Years since quitting: 2.9  . Smokeless tobacco: Never Used  Vaping Use  . Vaping Use: Never used  Substance Use Topics  . Alcohol use:  Yes    Alcohol/week: 0.0 standard drinks  . Drug use: Never    Home Medications Prior to Admission medications   Medication Sig Start Date End Date Taking? Authorizing Provider  hydrocortisone (ANUSOL-HC) 25 MG suppository Place 1 suppository (25 mg total) rectally 2 (two) times daily. 07/14/20   Wieters, Hallie C, PA-C  Multiple Vitamins-Calcium (ONE-A-DAY WOMENS FORMULA PO) Take 1 tablet by mouth daily.    [provider]    Allergies    Penicillins  Review of Systems   Review of Systems  Constitutional: Negative for chills and fever.  HENT: Negative.   Respiratory: Negative for cough and shortness of breath.   Cardiovascular: Negative for chest pain.  Gastrointestinal: Positive for abdominal pain, blood in stool and nausea. Negative for  constipation, diarrhea and vomiting.  Genitourinary: Negative for dysuria, frequency, vaginal bleeding and vaginal discharge.  Musculoskeletal: Negative for arthralgias and myalgias.  Neurological: Negative for dizziness, syncope and light-headedness.  All other systems reviewed and are negative.   Physical Exam Updated Vital Signs BP 125/85 (BP Location: Right Arm)   Pulse 81   Temp 98.6 F (37 C) (Oral)   Resp 16   Ht 6' (1.829 m)   Wt 90.7 kg   LMP 07/06/2020   SpO2 100%   BMI 27.12 kg/m   Physical Exam Vitals and nursing note reviewed.  Constitutional:      General: She is not in acute distress.    Appearance: She is well-developed and normal weight. She is not ill-appearing or diaphoretic.  HENT:     Head: Normocephalic and atraumatic.     Mouth/Throat:     Mouth: Mucous membranes are moist.     Pharynx: Oropharynx is clear.  Eyes:     General:        Right eye: No discharge.        Left eye: No discharge.     Pupils: Pupils are equal, round, and reactive to light.  Cardiovascular:     Rate and Rhythm: Normal rate and regular rhythm.     Heart sounds: Normal heart sounds.  Pulmonary:     Effort: Pulmonary effort is normal. No respiratory distress.     Breath sounds: Normal breath sounds. No wheezing or rales.  Abdominal:     General: Bowel sounds are normal. There is no distension.     Palpations: Abdomen is soft. There is no mass.     Tenderness: There is generalized abdominal tenderness and tenderness in the periumbilical area. There is no guarding.     Comments: Abdomen is soft, nondistended, bowel sounds present throughout.  There is mild generalized tenderness noted throughout the abdomen, severe in the periumbilical region  Genitourinary:    Comments: Patient had rectal exam yesterday with blood present, no palpable masses noted, she declines rectal exam today. Musculoskeletal:        General: No deformity.     Cervical back: Neck supple.  Skin:     General: Skin is warm and dry.     Capillary Refill: Capillary refill takes less than 2 seconds.  Neurological:     Mental Status: She is alert and oriented to person, place, and time.     Coordination: Coordination normal.     Comments: Speech is clear, able to follow commands Moves extremities without ataxia, coordination intact  Psychiatric:        Mood and Affect: Mood normal.        Behavior: Behavior normal.  ED Results / Procedures / Treatments   Labs (all labs ordered are listed, but only abnormal results are displayed) Labs Reviewed  COMPREHENSIVE METABOLIC PANEL - Abnormal; Notable for the following components:      Result Value   Glucose, Bld 103 (*)    Calcium 8.7 (*)    All other components within normal limits  CBC - Abnormal; Notable for the following components:   WBC 3.8 (*)    Hemoglobin 8.7 (*)    HCT 30.8 (*)    MCV 69.7 (*)    MCH 19.7 (*)    MCHC 28.2 (*)    RDW 18.7 (*)    All other components within normal limits  LIPASE, BLOOD  I-STAT BETA HCG BLOOD, ED ([facility], [facility], [facility] ONLY)    EKG None  Radiology CT ABDOMEN PELVIS W CONTRAST  Result Date: 07/15/2020 CLINICAL DATA:  Abdominal pain and rectal bleeding EXAM: CT ABDOMEN AND PELVIS WITH CONTRAST TECHNIQUE: Multidetector CT imaging of the abdomen and pelvis was performed using the standard protocol following bolus administration of intravenous contrast. CONTRAST:  OMNIPAQUE IOHEXOL 300 MG/ML  SOLN COMPARISON:  March 27, 2019 FINDINGS: Lower chest: There is a stable 5 mm nodular opacity in the right lower lobe, evident on axial slice 4 series 6. Lung bases otherwise clear. Hepatobiliary: No focal liver lesions are appreciable. Gallbladder absent. No biliary duct dilatation. Pancreas: No pancreatic mass or inflammatory focus. Spleen: No splenic lesions are evident. Adrenals/Urinary Tract: Adrenals bilaterally appear unremarkable. No evident renal mass or hydronephrosis on either side. No renal or  ureteral calculus on either side. Urinary bladder midline with wall thickness within normal limits. Stomach/Bowel: Patient is status post gastric bypass procedure. No wall thickening or fluid in postoperative regions. No appreciable bowel obstruction. Terminal ileum region appears unremarkable. No appendiceal inflammation. No free air or portal venous air. Note that there is moderate stool in the rectum with mild enhancement of the perirectal wall. Vascular/Lymphatic: No abdominal aortic aneurysm. No arterial vascular lesions are evident. Major venous structures are patent. No evident adenopathy in the abdomen or pelvis. Reproductive: Uterus is anteverted. No adnexal mass. A small amount of free fluid is noted in the cul-de-sac region. Other: No evident abscess in the abdomen or pelvis. No ascites beyond the mild fluid in the cul-de-sac region noted above. Musculoskeletal: No blastic or lytic bone lesions. No intramuscular or abdominal wall lesions. IMPRESSION: 1. Mild enhancement in the rectal wall region raising concern for a degree of proctitis. No soft tissue stranding or significant wall thickening in this area. 2. Slight fluid in the cul-de-sac may represent recent ovarian cyst rupture or could be secondary to nearby proctitis. 3. No bowel obstruction. No abscess in the abdomen or pelvis. Periappendiceal region appears normal. Evidence of previous gastric bypass procedure without complicating features. 4. No renal or ureteral calculus. No hydronephrosis. Urinary bladder wall thickness normal. Electronically Signed   By: Bretta Bang III M.D.   On: 07/15/2020 11:41     Procedures Procedures   Medications Ordered in ED Medications  sodium chloride 0.9 % bolus 1,000 mL (0 mLs Intravenous Stopped 07/15/20 1159)  ondansetron (ZOFRAN) injection 4 mg (4 mg Intravenous Given 07/15/20 1030)  dicyclomine (BENTYL) capsule 20 mg (20 mg Oral Given 07/15/20 1030)  iohexol (OMNIPAQUE) 300 MG/ML solution 100 mL  (100 mLs Intravenous Contrast Given 07/15/20 1055)  ciprofloxacin (CIPRO) tablet 500 mg (500 mg Oral Given 07/15/20 1225)  metroNIDAZOLE (FLAGYL) tablet 500 mg (500 mg Oral Given  07/15/20 1225)    ED Course  I have reviewed the triage vital signs and the nursing notes.  Pertinent labs & imaging results that were available during my care of the patient were reviewed by me and considered in my medical decision making (see chart for details).    MDM Rules/Calculators/A&P                         Patient presents to the ED with complaints of abdominal pain and rectal bleeding, no rectal pain. Patient nontoxic appearing, in no apparent distress, vitals WNL. On exam patient tender to palpation throughout, with tenderness most severe in the periumbilical region, no peritoneal signs.  Patient had rectal exam and urgent care yesterday with blood present without palpable lesions, declines repeat exam today.  Will evaluate with labs and CT abdomen pelvis. Analgesics, anti-emetics, and fluids administered.   Additional history obtained:  Additional history obtained from chart review & nursing note review.   Lab Tests:  I Ordered, reviewed, and interpreted labs, which included:  CBC: Mild leukopenia of 3.8, hemoglobin of 8.7 in the range of patient's baseline which usually ranges from 8-10, was 9.61-year ago CMP: Glucose 103, mild hypocalcemia, no other significant electrolyte derangements, normal renal and liver function Lipase: WNL Preg test: Negative  Imaging Studies ordered:  I ordered imaging studies which included CT abdomen pelvis, I independently reviewed, formal radiology impression shows:  Mild enhancement in the rectal region raising concern for proctitis, no soft tissue stranding or significant wall thickening.  Slight fluid in the cul-de-sac may be due to proctitis or recent ovarian cyst rupture.  No bowel obstruction, no abscess noted in the abdomen or pelvis.  Periappendiceal region is  normal.  No other acute findings.  ED Course:    RE-EVAL: After medications patient appears much more comfortable.  I discussed CT results with her.  We will treat with course of antibiotics for proctitis, but also discussed the possibility of IBD, no known family history, but stressed the importance of close follow-up with GI for further evaluation as she is certainly within the age to potentially be developing Crohn's or ulcerative colitis.  On repeat abdominal exam patient remains without peritoneal signs, low suspicion for cholecystitis, pancreatitis, diverticulitis, appendicitis, bowel obstruction/perforation,  PID, ectopic pregnancy, or other acute surgical process. Patient tolerating PO in the emergency department. Will discharge home with supportive measures. I discussed results, treatment plan, need for PCP and GI follow-up, and return precautions with the patient. Provided opportunity for questions, patient confirmed understanding and is in agreement with plan.    Portions of this note were generated with Scientist, clinical (histocompatibility and immunogenetics). Dictation errors may occur despite best attempts at proofreading.   Final Clinical Impression(s) / ED Diagnoses Final diagnoses:  Proctitis  Generalized abdominal pain  Lower GI bleed    Rx / DC Orders ED Discharge Orders         Ordered    ciprofloxacin (CIPRO) 500 MG tablet  2 times daily        07/15/20 1330    metroNIDAZOLE (FLAGYL) 500 MG tablet  2 times daily        07/15/20 1330    dicyclomine (BENTYL) 20 MG tablet  2 times daily        07/15/20 1330    ondansetron (ZOFRAN ODT) 4 MG disintegrating tablet        07/15/20 1330           Jodi Geralds  N, PA-C 07/19/20 1433    Arby BarrettePfeiffer, Marcy, MD 07/22/20 1306

## 2020-07-15 NOTE — ED Triage Notes (Signed)
Pt complains of rectal bleeding and lower abdominal pain x 2-3 days. Blood is bright red.

## 2020-07-15 NOTE — Discharge Instructions (Addendum)
Your lab work is overall reassuring.  Despite bleeding your hemoglobin has only dropped a little bit.  Your CT shows signs of proctitis, inflammation of the wall of your rectum.  This is likely what is causing your bleeding.  To treat proctitis take antibiotics Cipro and Flagyl twice daily, take with food on your stomach and do not drink alcohol while taking Flagyl.  It is important to complete entire course of antibiotics.  To help manage symptoms use Bentyl as needed for cramping abdominal pain and Zofran as needed for nausea or vomiting.  If you have continued or increased bleeding that is not improving return for reevaluation.  Sometimes proctitis can be seen in the setting of inflammatory bowel disease.  It is very important that you schedule follow-up appointment with [facility] for further evaluation.

## 2021-01-15 ENCOUNTER — Ambulatory Visit
Admission: EM | Admit: 2021-01-15 | Discharge: 2021-01-15 | Disposition: A | Discharge status: Home / Self Care | Payer: BC Managed Care – PPO | Attending: Internal Medicine | Admitting: Internal Medicine

## 2021-01-15 ENCOUNTER — Encounter: Payer: Self-pay | Admitting: Emergency Medicine

## 2021-01-15 ENCOUNTER — Other Ambulatory Visit: Payer: Self-pay

## 2021-01-15 DIAGNOSIS — R300 Dysuria: Secondary | ICD-10-CM | POA: Insufficient documentation

## 2021-01-15 DIAGNOSIS — R350 Frequency of micturition: Secondary | ICD-10-CM | POA: Diagnosis present

## 2021-01-15 DIAGNOSIS — N3001 Acute cystitis with hematuria: Secondary | ICD-10-CM | POA: Insufficient documentation

## 2021-01-15 LAB — POCT URINALYSIS DIP (MANUAL ENTRY)
Bilirubin, UA: NEGATIVE
Glucose, UA: NEGATIVE mg/dL
Ketones, POC UA: NEGATIVE mg/dL
Nitrite, UA: NEGATIVE
Protein Ur, POC: 30 mg/dL — AB
Spec Grav, UA: 1.025 (ref 1.010–1.025)
Urobilinogen, UA: 0.2 E.U./dL
pH, UA: 7 (ref 5.0–8.0)

## 2021-01-15 MED ORDER — NITROFURANTOIN MONOHYD MACRO 100 MG PO CAPS: 100.0000 mg | ORAL_CAPSULE | Freq: Two times a day (BID) | ORAL | 0 refills | Status: DC

## 2021-01-15 MED ORDER — FLUCONAZOLE 150 MG PO TABS: 150.0000 mg | ORAL_TABLET | Freq: Every day | ORAL | 0 refills | Status: DC

## 2021-01-15 NOTE — ED Provider Notes (Addendum)
[name]    CSN: [id number] Arrival date & time: 01/15/21  0801      History   Chief Complaint Chief Complaint  Patient presents with   Dysuria   Urinary Frequency    HPI Wendy Mcfarland is a 25 y.o. female.   Patient presents with 1 week history of dysuria, urinary frequency, mild hematuria.  Patient reports that hematuria has been a small amount.  Denies vaginal discharge, pelvic pain, abdominal pain, fever, back pain, irregular vaginal bleeding.  Last menstrual cycle was 12/27/2020.  Denies any known exposure to STD.   Dysuria Urinary Frequency   Past Medical History:  Diagnosis Date   Obesity    S/P cesarean section 03/30/2018   Urinary tract infection     Patient Active Problem List   Diagnosis Date Noted   S/P cesarean section 03/30/2018   Preterm premature rupture of membranes (PPROM) with unknown onset of labor 03/29/2018   Morbid obesity (HCC) 06/08/2016    Past Surgical History:  Procedure Laterality Date   CESAREAN SECTION MULTI-GESTATIONAL N/A 03/30/2018   Procedure: CESAREAN SECTION MULTI-GESTATIONAL;  Surgeon: Hermina Staggers, MD;  Location: [facility];  Service: Obstetrics;  Laterality: N/A;   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 Authorizing Provider  fluconazole (DIFLUCAN) 150 MG tablet Take 1 tablet (150 mg total) by mouth daily. 01/15/21  Yes Laquon Emel, Rolly Salter E, FNP  nitrofurantoin, macrocrystal-monohydrate, (MACROBID) 100 MG capsule Take 1 capsule (100 mg total) by mouth 2 (two) times daily. 01/15/21  Yes Deno Sida, Rolly Salter E, FNP  ciprofloxacin (CIPRO) 500 MG tablet Take 1 tablet (500 mg total) by mouth 2 (two) times  daily. One po bid x 7 days 07/15/20   Dartha Lodge, PA-C  dicyclomine (BENTYL) 20 MG tablet Take 1 tablet (20 mg total) by mouth 2 (two) times daily. 07/15/20   Dartha Lodge, PA-C  ferrous sulfate 325 (65 FE) MG tablet Take 325 mg by mouth daily with breakfast.    [provider]  hydrocortisone (ANUSOL-HC) 25 MG suppository Place 1 suppository (25 mg total) rectally 2 (two) times daily. 07/14/20   Wieters, Hallie C, PA-C  loratadine (CLARITIN) 10 MG tablet Take 10 mg by mouth daily as needed for allergies.    [provider]  metroNIDAZOLE (FLAGYL) 500 MG tablet Take 1 tablet (500 mg total) by mouth 2 (two) times daily. One po bid x 7 days 07/15/20   Dartha Lodge, PA-C  Multiple Vitamins-Calcium (ONE-A-DAY WOMENS FORMULA PO) Take 1 tablet by mouth daily.    [provider]  ondansetron (ZOFRAN ODT) 4 MG disintegrating tablet 4mg  ODT q4 hours prn nausea/vomit 07/15/20   07/17/20, PA-C    Family History Family History  Problem Relation Age of Onset   Hypertension Father    Diabetes Father    Hypertension Maternal Grandmother     Social History Social History  Tobacco Use   Smoking status: Former    Types: Cigarettes    Quit date: 07/22/2017    Years since quitting: 3.4   Smokeless tobacco: Never  Vaping Use   Vaping Use: Never used  Substance Use Topics   Alcohol use: Yes    Alcohol/week: 0.0 standard drinks   Drug use: Never     Allergies   Penicillins   Review of Systems Review of Systems Per HPI  Physical Exam Triage Vital Signs ED Triage Vitals  Enc Vitals Group     BP 01/15/21 0809 128/81     Pulse Rate 01/15/21 0809 74     Resp 01/15/21 0809 16     Temp 01/15/21 0809 98.2 F (36.8 C)     Temp Source 01/15/21 0809 Oral     SpO2 01/15/21 0809 100 %     Weight --      Height --      Head Circumference --      Peak Flow --      Pain Score 01/15/21 0818 2     Pain Loc --      Pain Edu? --      Excl. in [place]? --    No data  found.  Updated Vital Signs BP 128/81 (BP Location: Left Arm)   Pulse 74   Temp 98.2 F (36.8 C) (Oral)   Resp 16   SpO2 100%   Visual Acuity Right Eye Distance:   Left Eye Distance:   Bilateral Distance:    Right Eye Near:   Left Eye Near:    Bilateral Near:     Physical Exam Constitutional:      Appearance: Normal appearance.  HENT:     Head: Normocephalic and atraumatic.  Eyes:     Extraocular Movements: Extraocular movements intact.     Conjunctiva/sclera: Conjunctivae normal.  Cardiovascular:     Rate and Rhythm: Normal rate and regular rhythm.     Pulses: Normal pulses.     Heart sounds: Normal heart sounds.  Pulmonary:     Effort: Pulmonary effort is normal. No respiratory distress.     Breath sounds: Normal breath sounds.  Abdominal:     General: Bowel sounds are normal.     Palpations: Abdomen is soft.     Tenderness: There is no abdominal tenderness.  Neurological:     General: No focal deficit present.     Mental Status: She is alert and oriented to person, place, and time. Mental status is at baseline.  Psychiatric:        Mood and Affect: Mood normal.        Behavior: Behavior normal.        Thought Content: Thought content normal.        Judgment: Judgment normal.     UC Treatments / Results  Labs (all labs ordered are listed, but only abnormal results are displayed) Labs Reviewed  POCT URINALYSIS DIP (MANUAL ENTRY) - Abnormal; Notable for the following components:      Result Value   Clarity, UA cloudy (*)    Blood, UA large (*)    Protein Ur, POC =30 (*)    Leukocytes, UA Large (3+) (*)    All other components within normal limits  URINE CULTURE    EKG   Radiology No results found.  Procedures Procedures (including critical care time)  Medications Ordered in UC Medications - No data to display  Initial Impression / Assessment and Plan /  UC Course  I have reviewed the triage vital signs and the nursing notes.  Pertinent labs  & imaging results that were available during my care of the patient were reviewed by me and considered in my medical decision making (see chart for details).     Urinalysis indicating urinary tract infection.  Will treat with Macrobid x5 days.  Urine culture is pending.  Patient to increase clear oral fluid intake. Patient requesting diflucan as antibiotics typically cause yeast infections for her. Discussed strict return precautions. Patient verbalized understanding and is agreeable with plan.  Final Clinical Impressions(s) / UC Diagnoses   Final diagnoses:  Acute cystitis with hematuria  Dysuria  Urinary frequency     Discharge Instructions      Your urine shows signs of urinary tract infection.  You are being treated with Macrobid antibiotic.  Please pick this up from pharmacy.  Urine culture is pending. we will call if there are positives.     ED Prescriptions     Medication Sig Dispense Auth. Provider   nitrofurantoin, macrocrystal-monohydrate, (MACROBID) 100 MG capsule Take 1 capsule (100 mg total) by mouth 2 (two) times daily. 10 capsule Ervin Knack E, [place]   fluconazole (DIFLUCAN) 150 MG tablet Take 1 tablet (150 mg total) by mouth daily. 1 tablet [place], Acie Fredrickson, [place]      PDMP not reviewed this encounter.   Gustavus Bryant, [place] 01/15/21 0836    Gustavus Bryant, [place] 01/15/21 [phone]

## 2021-01-15 NOTE — Discharge Instructions (Signed)
Your urine shows signs of urinary tract infection.  You are being treated with Macrobid antibiotic.  Please pick this up from pharmacy.  Urine culture is pending. we will call if there are positives.

## 2021-01-15 NOTE — ED Triage Notes (Signed)
Dysuria with small amount of hematuria x 1 week.

## 2021-01-17 ENCOUNTER — Telehealth (HOSPITAL_COMMUNITY): Payer: Self-pay | Admitting: Emergency Medicine

## 2021-01-17 LAB — URINE CULTURE: Culture: >=100000 — AB

## 2021-01-17 MED ORDER — SULFAMETHOXAZOLE-TRIMETHOPRIM 800-160 MG PO TABS: 1.0000 | ORAL_TABLET | Freq: Two times a day (BID) | ORAL | 0 refills | Status: AC

## 2021-01-27 ENCOUNTER — Telehealth (HOSPITAL_COMMUNITY): Payer: Self-pay | Admitting: Emergency Medicine

## 2021-01-27 MED ORDER — FLUCONAZOLE 150 MG PO TABS: 150.0000 mg | ORAL_TABLET | Freq: Once | ORAL | 0 refills | Status: AC

## 2021-01-27 NOTE — Telephone Encounter (Signed)
Patient returned call after receiving mychart notification.  Reviewed results and new antibiotic sent to pharmacy on 10/29.  PAtient states she will follow-up.  States she will also need diflucan and antibiotic associated yeast.  Sent.

## 2021-02-13 ENCOUNTER — Ambulatory Visit
Admission: EM | Admit: 2021-02-13 | Discharge: 2021-02-13 | Disposition: A | Discharge status: Home / Self Care | Payer: BC Managed Care – PPO | Attending: Physician Assistant | Admitting: Physician Assistant

## 2021-02-13 ENCOUNTER — Encounter: Payer: Self-pay | Admitting: Emergency Medicine

## 2021-02-13 ENCOUNTER — Other Ambulatory Visit: Payer: Self-pay

## 2021-02-13 DIAGNOSIS — N3001 Acute cystitis with hematuria: Secondary | ICD-10-CM | POA: Insufficient documentation

## 2021-02-13 DIAGNOSIS — R3915 Urgency of urination: Secondary | ICD-10-CM | POA: Insufficient documentation

## 2021-02-13 LAB — POCT URINALYSIS DIP (MANUAL ENTRY)
Bilirubin, UA: NEGATIVE
Glucose, UA: 100 mg/dL — AB
Nitrite, UA: POSITIVE — AB
Protein Ur, POC: 100 mg/dL — AB
Spec Grav, UA: >=1.03 — AB (ref 1.010–1.025)
Urobilinogen, UA: 4 E.U./dL — AB
pH, UA: 6.5 (ref 5.0–8.0)

## 2021-02-13 MED ORDER — CIPROFLOXACIN HCL 500 MG PO TABS: 500.0000 mg | ORAL_TABLET | Freq: Two times a day (BID) | ORAL | 0 refills | Status: AC

## 2021-02-13 MED ORDER — FLUCONAZOLE 150 MG PO TABS: 150.0000 mg | ORAL_TABLET | Freq: Once | ORAL | 0 refills | Status: AC

## 2021-02-13 MED ORDER — NITROFURANTOIN MONOHYD MACRO 100 MG PO CAPS: 100.0000 mg | ORAL_CAPSULE | Freq: Two times a day (BID) | ORAL | 0 refills | Status: DC

## 2021-02-13 NOTE — ED Provider Notes (Signed)
[name]    CSN: [id number] Arrival date & time: 02/13/21  6283      History   Chief Complaint Chief Complaint  Patient presents with   Dysuria    HPI Wendy Mcfarland is a 25 y.o. female.   Patient here today for evaluation of urinary urgency, frequency, and dysuria that started yesterday.  She has not had fever.  She denies any nausea or vomiting.  She denies any back pain or abdominal pain.  She had recent UTI a month ago and was prescribed nitrofurantoin but then failed with urine culture that bacteria was resistant to same.  She states she was switched to Bactrim and did have improvement.  The history is provided by the patient.   Past Medical History:  Diagnosis Date   Obesity    S/P cesarean section 03/30/2018   Urinary tract infection     Patient Active Problem List   Diagnosis Date Noted   S/P cesarean section 03/30/2018   Preterm premature rupture of membranes (PPROM) with unknown onset of labor 03/29/2018   Morbid obesity (HCC) 06/08/2016    Past Surgical History:  Procedure Laterality Date   CESAREAN SECTION MULTI-GESTATIONAL N/A 03/30/2018   Procedure: CESAREAN SECTION MULTI-GESTATIONAL;  Surgeon: Hermina Staggers, MD;  Location: [facility];  Service: Obstetrics;  Laterality: N/A;   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 Authorizing Provider  ciprofloxacin (CIPRO) 500 MG tablet Take 1 tablet (500 mg total) by mouth every 12 (twelve) hours. 02/13/21  Yes Tomi Bamberger, PA-C  fluconazole (DIFLUCAN) 150 MG tablet Take 1 tablet (150 mg total) by mouth once for 1 dose. 02/13/21 02/13/21 Yes Tomi Bamberger, PA-C  Multiple  Vitamins-Calcium (ONE-A-DAY WOMENS FORMULA PO) Take 1 tablet by mouth daily.   Yes [provider]  nitrofurantoin, macrocrystal-monohydrate, (MACROBID) 100 MG capsule Take 1 capsule (100 mg total) by mouth 2 (two) times daily. 02/13/21  Yes Tomi Bamberger, PA-C  dicyclomine (BENTYL) 20 MG tablet Take 1 tablet (20 mg total) by mouth 2 (two) times daily. 07/15/20   Dartha Lodge, PA-C  ferrous sulfate 325 (65 FE) MG tablet Take 325 mg by mouth daily with breakfast.    [provider]  hydrocortisone (ANUSOL-HC) 25 MG suppository Place 1 suppository (25 mg total) rectally 2 (two) times daily. 07/14/20   Wieters, Hallie C, PA-C  loratadine (CLARITIN) 10 MG tablet Take 10 mg by mouth daily as needed for allergies.    [provider]  ondansetron (ZOFRAN ODT) 4 MG disintegrating tablet 4mg  ODT q4 hours prn nausea/vomit 07/15/20   07/17/20, PA-C    Family History Family History  Problem Relation Age of Onset   Hypertension Father    Diabetes Father    Hypertension Maternal Grandmother     Social History Social History   Tobacco Use   Smoking status: Former  Types: Cigarettes    Quit date: 07/22/2017    Years since quitting: 3.5   Smokeless tobacco: Never  Vaping Use   Vaping Use: Never used  Substance Use Topics   Alcohol use: Yes    Alcohol/week: 0.0 standard drinks   Drug use: Never     Allergies   Penicillins   Review of Systems Review of Systems  Constitutional:  Negative for chills and fever.  Respiratory:  Negative for shortness of breath.   Cardiovascular:  Negative for chest pain.  Gastrointestinal:  Negative for abdominal pain, nausea and vomiting.  Genitourinary:  Positive for dysuria, frequency and urgency.  Musculoskeletal:  Negative for back pain.    Physical Exam Triage Vital Signs ED Triage Vitals  Enc Vitals Group     BP      Pulse      Resp      Temp      Temp src      SpO2      Weight      Height      Head  Circumference      Peak Flow      Pain Score      Pain Loc      Pain Edu?      Excl. in [place]?    No data found.  Updated Vital Signs BP 127/87 (BP Location: Left Arm)   Pulse 96   Temp 98.9 F (37.2 C) (Oral)   Ht 6' (1.829 m)   Wt 208 lb (94.3 kg)   LMP 01/21/2021   SpO2 99%   BMI 28.21 kg/m    Physical Exam Vitals and nursing note reviewed.  Constitutional:      General: She is not in acute distress.    Appearance: Normal appearance. She is not ill-appearing.  HENT:     Head: Normocephalic and atraumatic.     Nose: Nose normal.  Cardiovascular:     Rate and Rhythm: Normal rate.  Pulmonary:     Effort: Pulmonary effort is normal.  Skin:    General: Skin is warm and dry.  Neurological:     Mental Status: She is alert.  Psychiatric:        Mood and Affect: Mood normal.        Thought Content: Thought content normal.     UC Treatments / Results  Labs (all labs ordered are listed, but only abnormal results are displayed) Labs Reviewed  POCT URINALYSIS DIP (MANUAL ENTRY) - Abnormal; Notable for the following components:      Result Value   Color, UA orange (*)    Clarity, UA cloudy (*)    Glucose, UA =100 (*)    Ketones, POC UA trace (5) (*)    Spec Grav, UA >=1.030 (*)    Blood, UA trace-intact (*)    Protein Ur, POC =100 (*)    Urobilinogen, UA 4.0 (*)    Nitrite, UA Positive (*)    Leukocytes, UA Small (1+) (*)    All other components within normal limits  URINE CULTURE    EKG   Radiology No results found.  Procedures Procedures (including critical care time)  Medications Ordered in UC Medications - No data to display  Initial Impression / Assessment and Plan / UC Course  I have reviewed the triage vital signs and the nursing notes.  Pertinent labs & imaging results that were available during my care of the patient were reviewed by me and considered in my  medical decision making (see chart for details).  Antibiotic prescribed for UTI.   Initially prescribed Macrobid but then after discussing with patient change this to Cipro.  Urine culture ordered.  Recommended follow-up if symptoms fail to improve or worsen anyway. Diflucan ordered at patient's request as she states she will typically get yeast infection following antibiotic therapy.   Final Clinical Impressions(s) / UC Diagnoses   Final diagnoses:  Urinary urgency  Acute cystitis with hematuria   Discharge Instructions   None    ED Prescriptions     Medication Sig Dispense Auth. Provider   nitrofurantoin, macrocrystal-monohydrate, (MACROBID) 100 MG capsule Take 1 capsule (100 mg total) by mouth 2 (two) times daily. 10 capsule Francene Finders, PA-C   ciprofloxacin (CIPRO) 500 MG tablet Take 1 tablet (500 mg total) by mouth every 12 (twelve) hours. 10 tablet Ewell Poe F, PA-C   fluconazole (DIFLUCAN) 150 MG tablet Take 1 tablet (150 mg total) by mouth once for 1 dose. 1 tablet Francene Finders, PA-C      PDMP not reviewed this encounter.   Francene Finders, PA-C 02/13/21 [phone]

## 2021-02-13 NOTE — ED Triage Notes (Signed)
Patient c/o pain in her urethra at the end of urination that started yesterday.  No hematuria.  Having urgency and frequency.

## 2021-02-15 LAB — URINE CULTURE: Culture: >=100000 — AB

## 2021-02-16 ENCOUNTER — Telehealth (HOSPITAL_COMMUNITY): Payer: Self-pay | Admitting: Emergency Medicine

## 2021-02-16 MED ORDER — CEPHALEXIN 500 MG PO CAPS: 500.0000 mg | ORAL_CAPSULE | Freq: Two times a day (BID) | ORAL | 0 refills | Status: AC

## 2021-10-18 ENCOUNTER — Emergency Department (HOSPITAL_COMMUNITY)
Admission: EM | Admit: 2021-10-18 | Discharge: 2021-10-18 | Disposition: A | Discharge status: Home / Self Care | Payer: Managed Care, Other (non HMO) | Attending: Emergency Medicine | Admitting: Emergency Medicine

## 2021-10-18 ENCOUNTER — Other Ambulatory Visit: Payer: Self-pay

## 2021-10-18 ENCOUNTER — Encounter (HOSPITAL_COMMUNITY): Payer: Self-pay

## 2021-10-18 DIAGNOSIS — Z79899 Other long term (current) drug therapy: Secondary | ICD-10-CM | POA: Insufficient documentation

## 2021-10-18 DIAGNOSIS — N390 Urinary tract infection, site not specified: Secondary | ICD-10-CM | POA: Diagnosis not present

## 2021-10-18 DIAGNOSIS — R300 Dysuria: Secondary | ICD-10-CM | POA: Diagnosis present

## 2021-10-18 LAB — URINALYSIS, ROUTINE W REFLEX MICROSCOPIC

## 2021-10-18 LAB — RAPID URINE DRUG SCREEN, HOSP PERFORMED
Amphetamines: NOT DETECTED
Barbiturates: NOT DETECTED
Benzodiazepines: NOT DETECTED
Cocaine: NOT DETECTED
Opiates: NOT DETECTED
Tetrahydrocannabinol: NOT DETECTED

## 2021-10-18 LAB — PREGNANCY, URINE: Preg Test, Ur: NEGATIVE

## 2021-10-18 LAB — URINALYSIS, MICROSCOPIC (REFLEX)
RBC / HPF: >50 RBC/hpf (ref 0–5)
WBC, UA: >50 WBC/hpf (ref 0–5)

## 2021-10-18 MED ORDER — FLUCONAZOLE 150 MG PO TABS: 150.0000 mg | ORAL_TABLET | Freq: Once | ORAL | 0 refills | Status: AC

## 2021-10-18 MED ORDER — CEPHALEXIN 500 MG PO CAPS: 500.0000 mg | ORAL_CAPSULE | Freq: Three times a day (TID) | ORAL | 0 refills | Status: AC

## 2021-10-18 NOTE — ED Provider Triage Note (Signed)
Emergency Medicine Provider Triage Evaluation Note  Wendy Mcfarland , a 26 y.o. female  was evaluated in triage.  Pt complains of dysuria and hematuria.  Patient reports symptoms started last night.  She reports suprapubic pain after urinating and pain at the urethra as well as hematuria.  She reports she has had the symptoms intermittently since November 2022 and has been diagnosed with multiple UTIs.  She reports that she takes antibiotics and symptoms resolved but then return after 2 to 3 weeks.  She has seen a urologist and gynecologist regarding this without any clear answers for her recurrent UTIs she is also had scans for kidney stones and they have not found any stones.  No associated fevers, vomiting or flank pain.  Review of Systems  Positive: Dysuria, hematuria Negative: Fever, vomiting, flank pain  Physical Exam  Pulse 75   Temp 98.9 F (37.2 C) (Oral)   Ht 5\' 11"  (1.803 m)   Wt 97.5 kg   LMP 10/05/2021   SpO2 100%   BMI 29.99 kg/m  Gen:   Awake, no distress   Resp:  Normal effort  MSK:   Moves extremities without difficulty  Other:  Mild suprapubic tenderness, no CVA tenderness  Medical Decision Making  Medically screening exam initiated at 8:43 AM.  Appropriate orders placed.  Wendy Mcfarland was informed that the remainder of the evaluation will be completed by another provider, this initial triage assessment does not replace that evaluation, and the importance of remaining in the ED until their evaluation is complete.  UA, culture and urine pregnancy ordered   Marvis Moeller, Dartha Lodge 10/18/21 [phone]

## 2021-10-18 NOTE — ED Triage Notes (Signed)
Patient c/o dysuria and hematuria  since yesterday. Patient states she has intermittent symptoms of the same since November 2022. Patient states she has been to a urologist previously.

## 2021-10-18 NOTE — ED Notes (Signed)
Reports hx on ongoing UTIs. Has been to GYN and urology w/o any real answers or conclusions. No suspicion for STIs, negative workup for kidney stones in the past. Is taking AZO currently. Bactrim usually works well for about a month or so. Urinates after intercourse. No anal, only vaginal penetration.

## 2021-10-18 NOTE — Discharge Instructions (Signed)
Take Keflex as prescribed for the next 7 days.  You can use Azo for the next 3 days but do not take longer than that as it can mask worsening symptoms.  Please call to schedule follow-up appointment with your urologist.  Make sure you are drinking plenty of fluids.  Return if you develop fevers, vomiting, flank pain or other new or worsening symptoms.

## 2021-10-18 NOTE — ED Provider Notes (Cosign Needed Addendum)
[facility]-EMERGENCY DEPT Provider Note   CSN: [id number] Arrival date & time: 10/18/21  7253     History  Chief Complaint  Patient presents with   Dysuria   Hematuria    Wendy Mcfarland is a 26 y.o. female.  Wendy Mcfarland , a 26 y.o. female with hx of recurrent UTIs complains of dysuria and hematuria.  Patient reports symptoms started last night.  She reports suprapubic pain after urinating and pain at the urethra as well as hematuria.  She reports she has had the symptoms intermittently since November 2022 and has been diagnosed with multiple UTIs.  She reports that she takes antibiotics and symptoms resolved but then return after 2 to 3 weeks.  She has seen a urologist and gynecologist regarding this without any clear answers for her recurrent UTIs she is also had scans for kidney stones and they have not found any stones.  No associated fevers, vomiting or flank pain.  The history is provided by the patient.  Dysuria Associated symptoms: no abdominal pain, no fever, no flank pain, no nausea, no vaginal discharge and no vomiting   Hematuria Pertinent negatives include no abdominal pain.       Home Medications Prior to Admission medications   Medication Sig Start Date End Date Taking? Authorizing Provider  cephALEXin (KEFLEX) 500 MG capsule Take 1 capsule (500 mg total) by mouth 3 (three) times daily for 7 days. 10/18/21 10/25/21 Yes Dartha Lodge, PA-C  fluconazole (DIFLUCAN) 150 MG tablet Take 1 tablet (150 mg total) by mouth once for 1 dose. Take 1 tablet, if symptoms persist please take a second dose after 3 days 10/18/21 10/18/21 Yes Dartha Lodge, PA-C  ciprofloxacin (CIPRO) 500 MG tablet Take 1 tablet (500 mg total) by mouth every 12 (twelve) hours. 02/13/21   Tomi Bamberger, PA-C  dicyclomine (BENTYL) 20 MG tablet Take 1 tablet (20 mg total) by mouth 2 (two) times daily. 07/15/20   Dartha Lodge, PA-C  ferrous sulfate 325 (65 FE) MG tablet Take 325 mg by  mouth daily with breakfast.    [provider]  hydrocortisone (ANUSOL-HC) 25 MG suppository Place 1 suppository (25 mg total) rectally 2 (two) times daily. 07/14/20   Wieters, Hallie C, PA-C  loratadine (CLARITIN) 10 MG tablet Take 10 mg by mouth daily as needed for allergies.    [provider]  Multiple Vitamins-Calcium (ONE-A-DAY WOMENS FORMULA PO) Take 1 tablet by mouth daily.    [provider]  ondansetron (ZOFRAN ODT) 4 MG disintegrating tablet 4mg  ODT q4 hours prn nausea/vomit 07/15/20   07/17/20, PA-C      Allergies    Penicillins    Review of Systems   Review of Systems  Constitutional:  Negative for chills and fever.  Gastrointestinal:  Negative for abdominal pain, nausea and vomiting.  Genitourinary:  Positive for dysuria and hematuria. Negative for flank pain, vaginal bleeding and vaginal discharge.    Physical Exam Updated Vital Signs BP (!) 117/96 (BP Location: Left Arm)   Pulse 69   Temp 98.9 F (37.2 C) (Oral)   Resp 17   Ht 5\' 11"  (1.803 m)   Wt 97.5 kg   LMP 10/05/2021   SpO2 98%   BMI 29.99 kg/m  Physical Exam Vitals and nursing note reviewed.  Constitutional:      General: She is not in acute distress.    Appearance: Normal appearance. She is well-developed. She is not ill-appearing or diaphoretic.  HENT:     Head: Normocephalic and atraumatic.  Eyes:     General:        Right eye: No discharge.        Left eye: No discharge.  Cardiovascular:     Rate and Rhythm: Normal rate and regular rhythm.     Pulses: Normal pulses.     Heart sounds: Normal heart sounds.  Pulmonary:     Effort: Pulmonary effort is normal. No respiratory distress.     Breath sounds: Normal breath sounds. No wheezing or rales.     Comments: Respirations equal and unlabored, patient able to speak in full sentences, lungs clear to auscultation bilaterally  Abdominal:     General: Bowel sounds are normal. There is no distension.     Palpations:  Abdomen is soft. There is no mass.     Tenderness: There is no abdominal tenderness. There is no right CVA tenderness, left CVA tenderness or guarding.     Comments: Abdomen soft, nondistended, nontender to palpation in all quadrants without guarding or peritoneal signs  Musculoskeletal:        General: No deformity.     Cervical back: Neck supple.  Skin:    General: Skin is warm and dry.     Capillary Refill: Capillary refill takes less than 2 seconds.  Neurological:     Mental Status: She is alert and oriented to person, place, and time.     Coordination: Coordination normal.     Comments: Speech is clear, able to follow commands Moves extremities without ataxia, coordination intact  Psychiatric:        Mood and Affect: Mood normal.        Behavior: Behavior normal.     ED Results / Procedures / Treatments   Labs (all labs ordered are listed, but only abnormal results are displayed) Labs Reviewed  URINALYSIS, ROUTINE W REFLEX MICROSCOPIC - Abnormal; Notable for the following components:      Result Value   Color, Urine ORANGE (*)    APPearance HAZY (*)    Glucose, UA   (*)    Value: TEST NOT REPORTED DUE TO COLOR INTERFERENCE OF URINE PIGMENT   Hgb urine dipstick   (*)    Value: TEST NOT REPORTED DUE TO COLOR INTERFERENCE OF URINE PIGMENT   Bilirubin Urine   (*)    Value: TEST NOT REPORTED DUE TO COLOR INTERFERENCE OF URINE PIGMENT   Ketones, ur   (*)    Value: TEST NOT REPORTED DUE TO COLOR INTERFERENCE OF URINE PIGMENT   Protein, ur   (*)    Value: TEST NOT REPORTED DUE TO COLOR INTERFERENCE OF URINE PIGMENT   Nitrite   (*)    Value: TEST NOT REPORTED DUE TO COLOR INTERFERENCE OF URINE PIGMENT   Leukocytes,Ua   (*)    Value: TEST NOT REPORTED DUE TO COLOR INTERFERENCE OF URINE PIGMENT   All other components within normal limits  URINALYSIS, MICROSCOPIC (REFLEX) - Abnormal; Notable for the following components:   Bacteria, UA RARE (*)    All other components within  normal limits  RAPID URINE DRUG SCREEN, HOSP PERFORMED  PREGNANCY, URINE    EKG None  Radiology No results found.  Procedures Procedures    Medications Ordered in ED Medications - No data to display  ED Course/ Medical Decision Making/ A&P  Medical Decision Making Amount and/or Complexity of Data Reviewed Labs: ordered.  Risk Prescription drug management.   Patient with history of recurrent UTIs presents with hematuria and dysuria starting last night.  No fevers, patient is well-appearing with normal vitals.  No nausea or vomiting, tolerating p.o. no flank pain to suggest pyelonephritis.  Patient has been taking Azo so urinalysis is limited but does show some bacteria with > 50 RBCs and WBCs.  Will send for culture.  Reviewed culture reports from prior visits for urinalysis, grew out Proteus which was sensitive to cephalosporins.  Will treat with Keflex.  Patient has upcoming follow-up appoint with urology regarding recurrent UTIs.  Discussed return precautions.  Discharged home in good condition.        Final Clinical Impression(s) / ED Diagnoses Final diagnoses:  Acute UTI    Rx / DC Orders ED Discharge Orders          Ordered    cephALEXin (KEFLEX) 500 MG capsule  3 times daily        10/18/21 1315    fluconazole (DIFLUCAN) 150 MG tablet   Once        10/18/21 1338              Dartha Lodge, PA-C 10/18/21 1600    Dartha Lodge, [place] 10/18/21 1602    Terald Sleeper, MD 10/19/21 [phone]

## 2022-02-04 ENCOUNTER — Ambulatory Visit
Admission: EM | Admit: 2022-02-04 | Discharge: 2022-02-04 | Disposition: A | Discharge status: Home / Self Care | Payer: Managed Care, Other (non HMO) | Attending: Physician Assistant | Admitting: Physician Assistant

## 2022-02-04 ENCOUNTER — Other Ambulatory Visit: Payer: Self-pay

## 2022-02-04 ENCOUNTER — Encounter: Payer: Self-pay | Admitting: Emergency Medicine

## 2022-02-04 DIAGNOSIS — R300 Dysuria: Secondary | ICD-10-CM

## 2022-02-04 LAB — POCT URINALYSIS DIP (MANUAL ENTRY)
Bilirubin, UA: NEGATIVE
Blood, UA: NEGATIVE
Glucose, UA: NEGATIVE mg/dL
Ketones, POC UA: NEGATIVE mg/dL
Leukocytes, UA: NEGATIVE
Nitrite, UA: NEGATIVE
Protein Ur, POC: 30 mg/dL — AB
Spec Grav, UA: >=1.03 — AB (ref 1.010–1.025)
Urobilinogen, UA: 2 E.U./dL — AB
pH, UA: 6.5 (ref 5.0–8.0)

## 2022-02-04 LAB — POCT URINE PREGNANCY: Preg Test, Ur: NEGATIVE

## 2022-02-04 MED ORDER — CEPHALEXIN 250 MG PO CAPS: 250.0000 mg | ORAL_CAPSULE | Freq: Four times a day (QID) | ORAL | 0 refills | Status: DC

## 2022-02-04 NOTE — ED Provider Notes (Signed)
[name]    CSN: [id number] Arrival date & time: 02/04/22  0809      History   Chief Complaint Chief Complaint  Patient presents with   Dysuria    HPI Wendy Mcfarland is a 26 y.o. female.   Patient here today for evaluation of dysuria that started recently.  She reports that she does have history of UTIs starting last year.  She states that she did get prescription for Bactrim on the Internet and has been taking this but symptoms have continued.  She has not had any vomiting.  She denies any back pain.  She has not had fever.  She has seen neurologist in the past.  The history is provided by the patient.  Dysuria Associated symptoms: no abdominal pain, no fever, no flank pain, no nausea and no vomiting     Past Medical History:  Diagnosis Date   Obesity    S/P cesarean section 03/30/2018   Urinary tract infection     Patient Active Problem List   Diagnosis Date Noted   S/P cesarean section 03/30/2018   Preterm premature rupture of membranes (PPROM) with unknown onset of labor 03/29/2018   Morbid obesity (HCC) 06/08/2016    Past Surgical History:  Procedure Laterality Date   CESAREAN SECTION MULTI-GESTATIONAL N/A 03/30/2018   Procedure: CESAREAN SECTION MULTI-GESTATIONAL;  Surgeon: Hermina Staggers, MD;  Location: [facility];  Service: Obstetrics;  Laterality: N/A;   CHOLECYSTECTOMY     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 Authorizing Provider  cephALEXin (KEFLEX) 250 MG capsule Take 1 capsule (250 mg total) by mouth 4 (four) times daily. 02/04/22  Yes Tomi Bamberger, PA-C  ciprofloxacin (CIPRO) 500 MG tablet Take 1 tablet (500  mg total) by mouth every 12 (twelve) hours. Patient not taking: Reported on 02/04/2022 02/13/21   Tomi Bamberger, PA-C  dicyclomine (BENTYL) 20 MG tablet Take 1 tablet (20 mg total) by mouth 2 (two) times daily. 07/15/20   Dartha Lodge, PA-C  ferrous sulfate 325 (65 FE) MG tablet Take 325 mg by mouth daily with breakfast.    [provider]  hydrocortisone (ANUSOL-HC) 25 MG suppository Place 1 suppository (25 mg total) rectally 2 (two) times daily. 07/14/20   Wieters, Hallie C, PA-C  loratadine (CLARITIN) 10 MG tablet Take 10 mg by mouth daily as needed for allergies.    [provider]  Multiple Vitamins-Calcium (ONE-A-DAY WOMENS FORMULA PO) Take 1 tablet by mouth daily.    [provider]  ondansetron (ZOFRAN ODT) 4 MG disintegrating tablet 4mg  ODT q4 hours prn nausea/vomit 07/15/20   07/17/20, PA-C    Family History Family History  Problem Relation Age of Onset   Hypertension Father    Diabetes Father    Hypertension Maternal Grandmother     Social History Social History   Tobacco Use   Smoking status:  Former    Types: Cigarettes    Quit date: 07/22/2017    Years since quitting: 4.5   Smokeless tobacco: Never  Vaping Use   Vaping Use: Never used  Substance Use Topics   Alcohol use: Yes    Alcohol/week: 0.0 standard drinks of alcohol   Drug use: Never     Allergies   Penicillins   Review of Systems Review of Systems  Constitutional:  Negative for chills and fever.  Eyes:  Negative for discharge and redness.  Gastrointestinal:  Negative for abdominal pain, nausea and vomiting.  Genitourinary:  Positive for dysuria. Negative for flank pain.     Physical Exam Triage Vital Signs ED Triage Vitals [02/04/22 0841]  Enc Vitals Group     BP 120/60     Pulse Rate 84     Resp 18     Temp 98.5 F (36.9 C)     Temp Source Oral     SpO2 100 %     Weight      Height      Head Circumference      Peak Flow      Pain Score 3     Pain  Loc      Pain Edu?      Excl. in GC?    No data found.  Updated Vital Signs BP 120/60 (BP Location: Left Arm)   Pulse 84   Temp 98.5 F (36.9 C) (Oral)   Resp 18   SpO2 100%   Physical Exam Vitals and nursing note reviewed.  Constitutional:      General: She is not in acute distress.    Appearance: Normal appearance. She is not ill-appearing.  HENT:     Head: Normocephalic and atraumatic.  Eyes:     Conjunctiva/sclera: Conjunctivae normal.  Cardiovascular:     Rate and Rhythm: Normal rate.  Pulmonary:     Effort: Pulmonary effort is normal.  Neurological:     Mental Status: She is alert.  Psychiatric:        Mood and Affect: Mood normal.        Behavior: Behavior normal.        Thought Content: Thought content normal.      UC Treatments / Results  Labs (all labs ordered are listed, but only abnormal results are displayed) Labs Reviewed  POCT URINALYSIS DIP (MANUAL ENTRY) - Abnormal; Notable for the following components:      Result Value   Color, UA orange (*)    Clarity, UA cloudy (*)    Spec Grav, UA >=1.030 (*)    Protein Ur, POC =30 (*)    Urobilinogen, UA 2.0 (*)    All other components within normal limits  URINE CULTURE  POCT URINE PREGNANCY    EKG   Radiology No results found.  Procedures Procedures (including critical care time)  Medications Ordered in UC Medications - No data to display  Initial Impression / Assessment and Plan / UC Course  I have reviewed the triage vital signs and the nursing notes.  Pertinent labs & imaging results that were available during my care of the patient were reviewed by me and considered in my medical decision making (see chart for details).    Discussed that UA was not concerning for infection however given continued symptoms will treat with additional antibiotic- patient requests keflex as this was helpful for her with her last UTI. Keflex prescribed and urine culture ordered. Recommend follow up with  any further concerns.   Final Clinical Impressions(s) / UC Diagnoses   Final diagnoses:  Dysuria   Discharge Instructions   None    ED Prescriptions     Medication Sig Dispense Auth. Provider   cephALEXin (KEFLEX) 250 MG capsule Take 1 capsule (250 mg total) by mouth 4 (four) times daily. 28 capsule Tomi Bamberger, PA-C      PDMP not reviewed this encounter.   Tomi Bamberger, PA-C 02/04/22 [phone]

## 2022-02-04 NOTE — ED Triage Notes (Signed)
Pt here for dysuria with hx of same; pt sts she held her urine

## 2022-02-05 LAB — URINE CULTURE

## 2022-08-25 ENCOUNTER — Ambulatory Visit
Admission: EM | Admit: 2022-08-25 | Discharge: 2022-08-25 | Disposition: A | Discharge status: Home / Self Care | Payer: Managed Care, Other (non HMO) | Attending: Internal Medicine | Admitting: Internal Medicine

## 2022-08-25 DIAGNOSIS — R300 Dysuria: Secondary | ICD-10-CM | POA: Diagnosis not present

## 2022-08-25 DIAGNOSIS — R350 Frequency of micturition: Secondary | ICD-10-CM | POA: Insufficient documentation

## 2022-08-25 DIAGNOSIS — N3001 Acute cystitis with hematuria: Secondary | ICD-10-CM | POA: Diagnosis present

## 2022-08-25 LAB — POCT URINALYSIS DIP (MANUAL ENTRY)
Bilirubin, UA: NEGATIVE
Glucose, UA: NEGATIVE mg/dL
Ketones, POC UA: NEGATIVE mg/dL
Nitrite, UA: NEGATIVE
Protein Ur, POC: 30 mg/dL — AB
Spec Grav, UA: 1.015 (ref 1.010–1.025)
Urobilinogen, UA: 2 E.U./dL — AB
pH, UA: 7 (ref 5.0–8.0)

## 2022-08-25 MED ORDER — FLUCONAZOLE 150 MG PO TABS: 150.0000 mg | ORAL_TABLET | Freq: Every day | ORAL | 0 refills | Status: AC

## 2022-08-25 MED ORDER — CEPHALEXIN 500 MG PO CAPS: 500.0000 mg | ORAL_CAPSULE | Freq: Two times a day (BID) | ORAL | 0 refills | Status: AC

## 2022-08-25 NOTE — ED Triage Notes (Signed)
Pt states burning with urination for the past 2 days. 

## 2022-08-25 NOTE — Discharge Instructions (Signed)
I have prescribed you an antibiotic for urinary tract infection.  Please follow-up if any symptoms persist or worsen.

## 2022-08-25 NOTE — ED Provider Notes (Signed)
Wendy Mcfarland    CSN: [id number] Arrival date & time: 08/25/22  0800      History   Chief Complaint Chief Complaint  Patient presents with   Dysuria    HPI Wendy Mcfarland is a 27 y.o. female.   Patient presents with urinary burning, urinary frequency, bladder pressure that started 2 days ago.  Patient denies hematuria, vaginal discharge, back pain, chills, nausea, vomiting, fever.  Denies exposure to STD.  Last menstrual cycle was 08/06/2022.   Dysuria   Past Medical History:  Diagnosis Date   Obesity    S/P cesarean section 03/30/2018   Urinary tract infection     Patient Active Problem List   Diagnosis Date Noted   S/P cesarean section 03/30/2018   Preterm premature rupture of membranes (PPROM) with unknown onset of labor 03/29/2018   Morbid obesity (HCC) 06/08/2016    Past Surgical History:  Procedure Laterality Date   CESAREAN SECTION MULTI-GESTATIONAL N/A 03/30/2018   Procedure: CESAREAN SECTION MULTI-GESTATIONAL;  Surgeon: Hermina Staggers, MD;  Location: [facility];  Service: Obstetrics;  Laterality: N/A;   CHOLECYSTECTOMY     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 Authorizing Provider  cephALEXin (KEFLEX) 500 MG capsule Take 1 capsule (500 mg total) by mouth 2 (two) times daily for 7 days. 08/25/22 09/01/22 Yes Jerriyah Louis, Acie Fredrickson, FNP  fluconazole (DIFLUCAN) 150 MG tablet Take 1 tablet (150 mg total) by mouth daily. Take at first sign of vaginal yeast. 08/25/22  Yes Gustavus Bryant, FNP  ciprofloxacin (CIPRO) 500 MG tablet Take 1 tablet (500 mg total) by mouth every 12 (twelve) hours. Patient not taking: Reported on 02/04/2022 02/13/21    Tomi Bamberger, PA-C  dicyclomine (BENTYL) 20 MG tablet Take 1 tablet (20 mg total) by mouth 2 (two) times daily. 07/15/20   Dartha Lodge, PA-C  ferrous sulfate 325 (65 FE) MG tablet Take 325 mg by mouth daily with breakfast.    [provider]  hydrocortisone (ANUSOL-HC) 25 MG suppository Place 1 suppository (25 mg total) rectally 2 (two) times daily. 07/14/20   Wieters, Hallie C, PA-C  loratadine (CLARITIN) 10 MG tablet Take 10 mg by mouth daily as needed for allergies.    [provider]  Multiple Vitamins-Calcium (ONE-A-DAY WOMENS FORMULA PO) Take 1 tablet by mouth daily.    [provider]  ondansetron (ZOFRAN ODT) 4 MG disintegrating tablet 4mg  ODT q4 hours prn nausea/vomit 07/15/20   Dartha Lodge, PA-C    Family History Family History  Problem Relation Age of Onset   Hypertension Father    Diabetes Father    Hypertension Maternal Grandmother     Social History Social History   Tobacco Use   Smoking status: Former    Types: Cigarettes    Quit date: 07/22/2017    Years since quitting: 5.0   Smokeless tobacco: Never  Vaping Use  Vaping Use: Never used  Substance Use Topics   Alcohol use: Yes    Alcohol/week: 0.0 standard drinks of alcohol   Drug use: Never     Allergies   Penicillins   Review of Systems Review of Systems Per HPI  Physical Exam Triage Vital Signs ED Triage Vitals [08/25/22 0820]  Enc Vitals Group     BP 109/73     Pulse Rate 73     Resp 16     Temp 98.3 F (36.8 C)     Temp Source Oral     SpO2 100 %     Weight      Height      Head Circumference      Peak Flow      Pain Score 0     Pain Loc      Pain Edu?      Excl. in GC?    No data found.  Updated Vital Signs BP 109/73 (BP Location: Left Arm)   Pulse 73   Temp 98.3 F (36.8 C) (Oral)   Resp 16   LMP 08/06/2022 (Exact Date)   SpO2 100%   Visual Acuity Right Eye Distance:   Left Eye Distance:   Bilateral Distance:    Right Eye Near:    Left Eye Near:    Bilateral Near:     Physical Exam Constitutional:      General: She is not in acute distress.    Appearance: Normal appearance. She is not toxic-appearing or diaphoretic.  HENT:     Head: Normocephalic and atraumatic.  Eyes:     Extraocular Movements: Extraocular movements intact.     Conjunctiva/sclera: Conjunctivae normal.  Pulmonary:     Effort: Pulmonary effort is normal.  Neurological:     General: No focal deficit present.     Mental Status: She is alert and oriented to person, place, and time. Mental status is at baseline.  Psychiatric:        Mood and Affect: Mood normal.        Behavior: Behavior normal.        Thought Content: Thought content normal.        Judgment: Judgment normal.      UC Treatments / Results  Labs (all labs ordered are listed, but only abnormal results are displayed) Labs Reviewed  POCT URINALYSIS DIP (MANUAL ENTRY) - Abnormal; Notable for the following components:      Result Value   Clarity, UA cloudy (*)    Blood, UA small (*)    Protein Ur, POC =30 (*)    Urobilinogen, UA 2.0 (*)    Leukocytes, UA Moderate (2+) (*)    All other components within normal limits  URINE CULTURE    EKG   Radiology No results found.  Procedures Procedures (including critical Mcfarland time)  Medications Ordered in UC Medications - No data to display  Initial Impression / Assessment and Plan / UC Course  I have reviewed the triage vital signs and the nursing notes.  Pertinent labs & imaging results that were available during my Mcfarland of the patient were reviewed by me and considered in my medical decision making (see chart for details).  Clinical Course as of 08/25/22 0925  Wed Aug 25, 2022  0836 Color, UA: yellow [HM]    Clinical Course User Index [HM] Gustavus Bryant, [place]    UA indicating urinary tract infection.  Will treat with cephalexin as patient has taken this before  and tolerated well.  Urine culture pending.  Will  await results for any change in treatment.  Patient requesting Diflucan given antibiotics typically give her yeast infection so this was sent to take at first sign of vaginal yeast.  Advised strict return precautions.  Patient verbalized understanding and was agreeable with plan. Final Clinical Impressions(s) / UC Diagnoses   Final diagnoses:  Acute cystitis with hematuria  Dysuria  Urinary frequency     Discharge Instructions      I have prescribed you an antibiotic for urinary tract infection.  Please follow-up if any symptoms persist or worsen.    ED Prescriptions     Medication Sig Dispense Auth. Provider   cephALEXin (KEFLEX) 500 MG capsule Take 1 capsule (500 mg total) by mouth 2 (two) times daily for 7 days. 14 capsule [place], Arnold Line E, [place]   fluconazole (DIFLUCAN) 150 MG tablet Take 1 tablet (150 mg total) by mouth daily. Take at first sign of vaginal yeast. 1 tablet [place], Acie Fredrickson, [place]      PDMP not reviewed this encounter.   Gustavus Bryant, [place] 08/25/22 [phone]

## 2022-08-27 LAB — URINE CULTURE: Culture: 40000 — AB

## 2022-11-12 IMAGING — CT CT ABD-PELV W/ CM
2 of 4 series · 16 of 46 positions shown, 18 images · IV contrast (OMNIPAQUE 300)
Comparison: March 27, 2019

CLINICAL DATA: Abdominal pain and rectal bleeding

EXAM:
CT ABDOMEN AND PELVIS WITH CONTRAST
TECHNIQUE: Multidetector CT imaging of the abdomen and pelvis was performed
using the standard protocol following bolus administration of
intravenous contrast.
CONTRAST:  100mL OMNIPAQUE IOHEXOL 300 MG/ML  SOLN

[Series 2: axial st · axial · 0.76mm/px · z∈[+1141,+1541]mm · 13 of 91 slices shown, 15 images]
[im 6/91  soft-tissue]
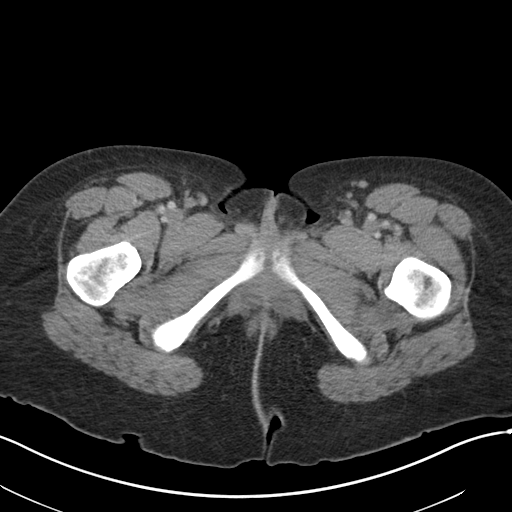
[im 6/91  bone]
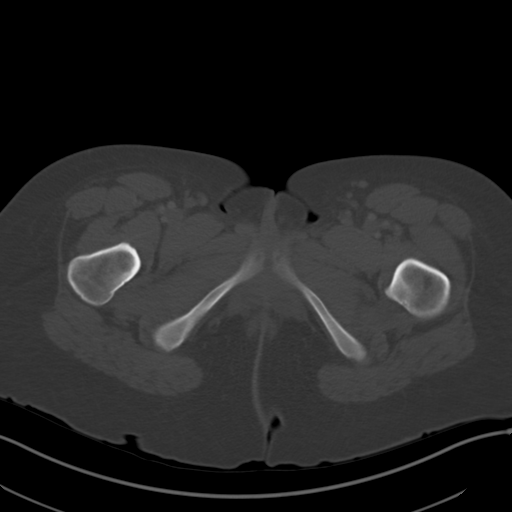
[im 11/91  soft-tissue]
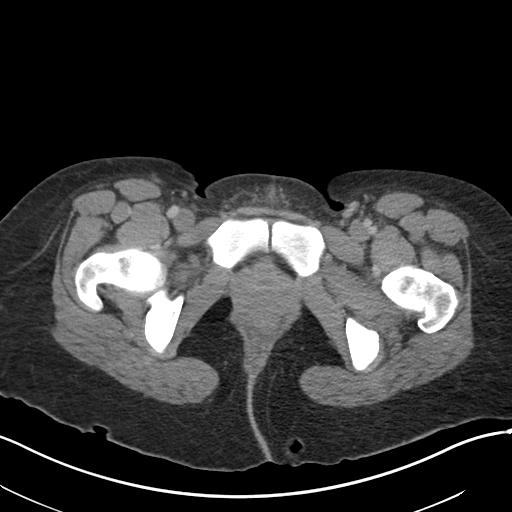
[im 21/91  soft-tissue]
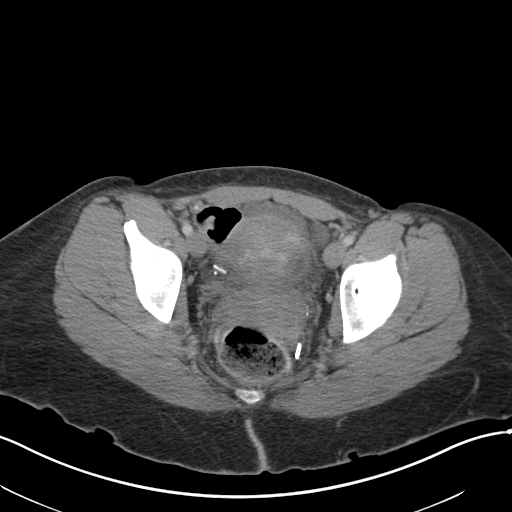
[im 26/91  soft-tissue]
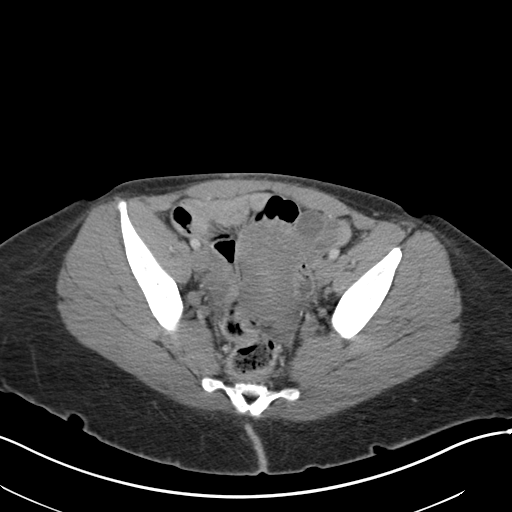
[im 31/91  soft-tissue]
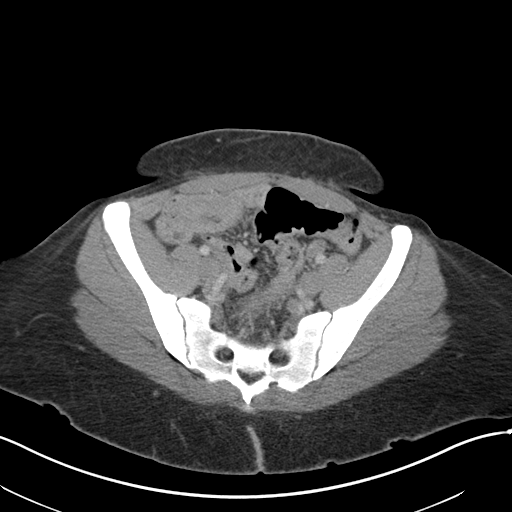
[im 41/91  soft-tissue]
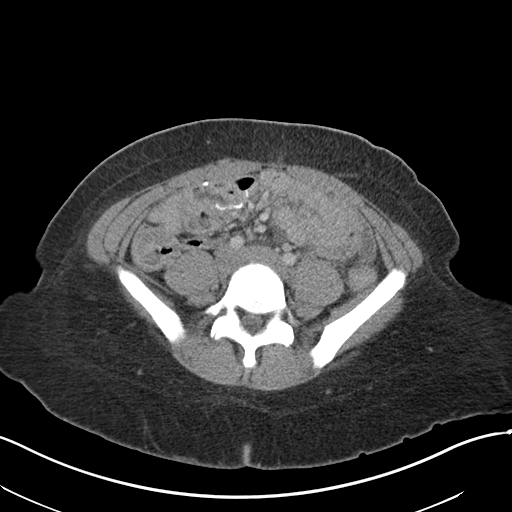
[im 46/91  soft-tissue]
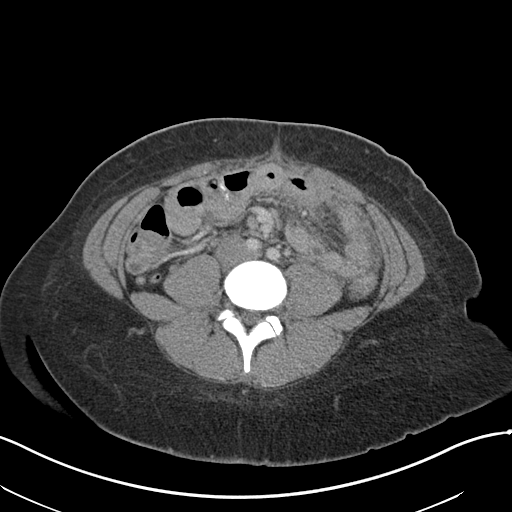
[im 51/91  soft-tissue]
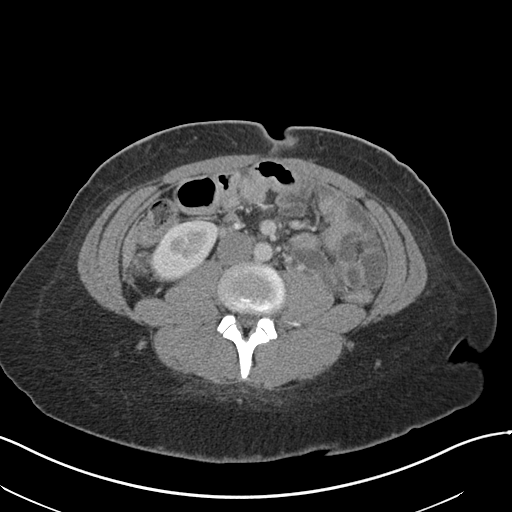
[im 61/91  soft-tissue]
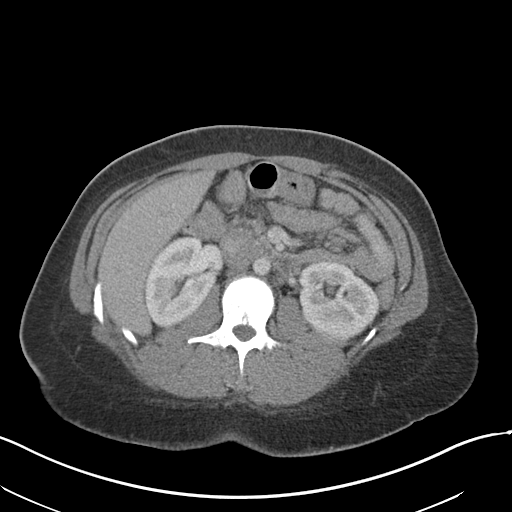
[im 61/91  bone]
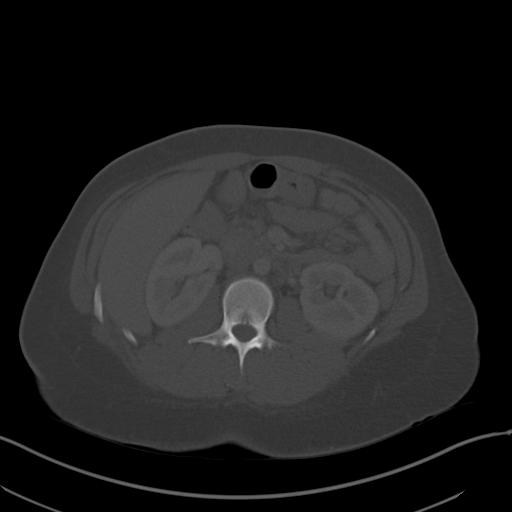
[im 66/91  soft-tissue]
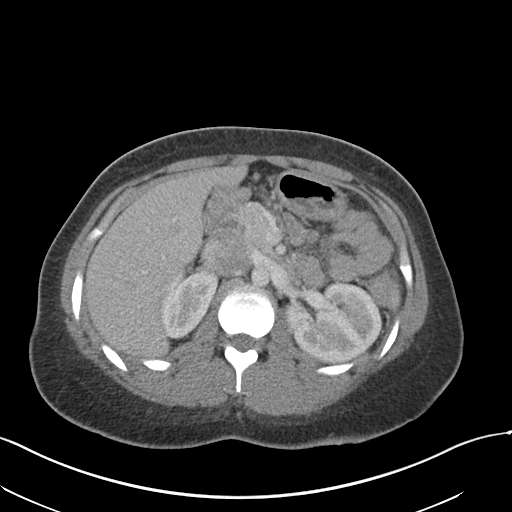
[im 71/91  soft-tissue]
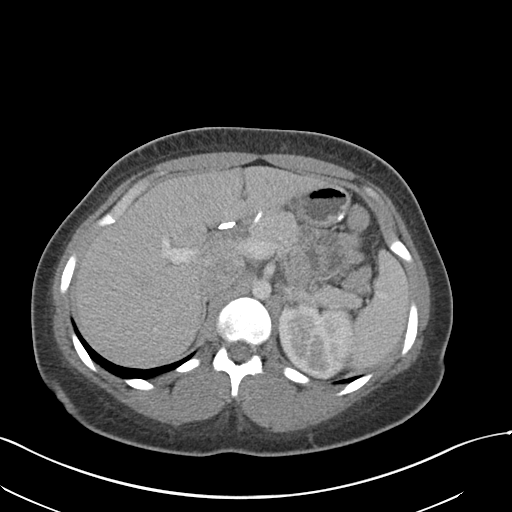
[im 81/91  soft-tissue]
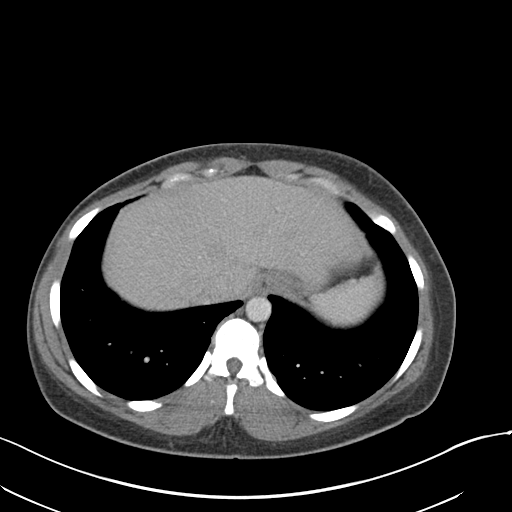
[im 86/91  soft-tissue]
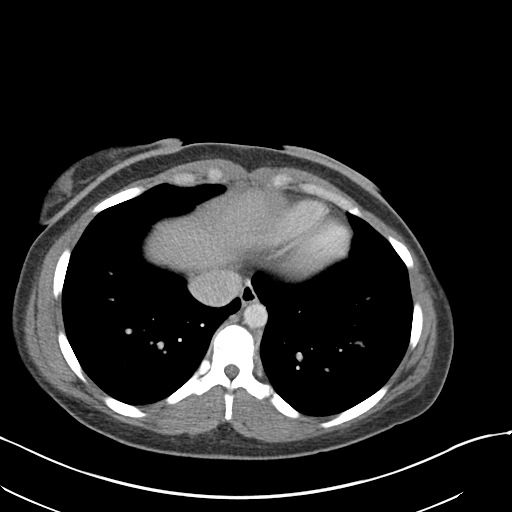

[Series 4: coronal st · coronal · 0.93mm/px · 3 of 113 slices shown]
[im 38/113  soft-tissue]
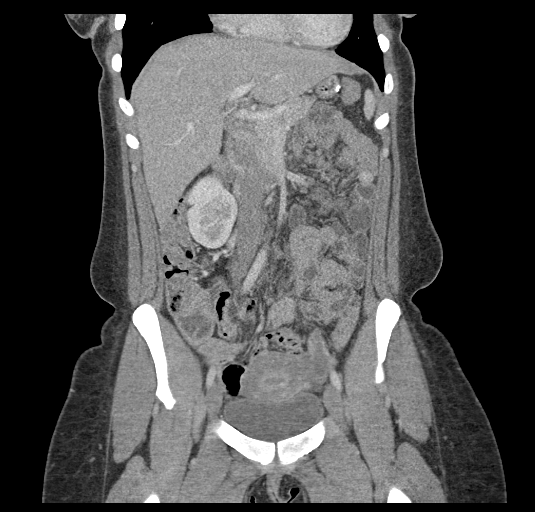
[im 50/113  soft-tissue]
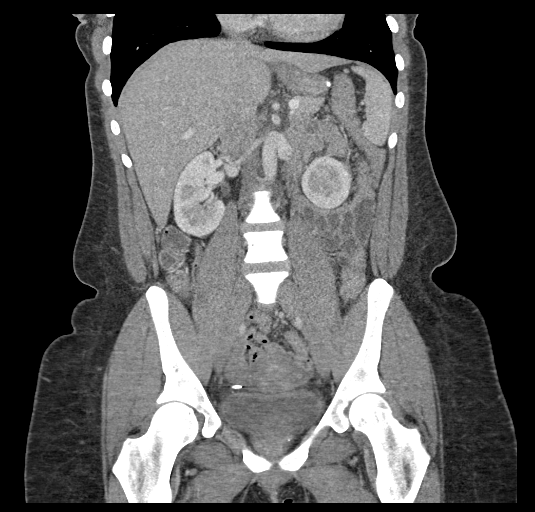
[im 63/113  soft-tissue]
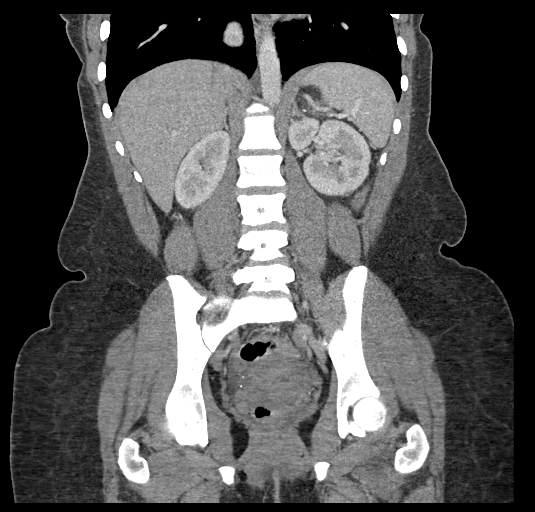

[16 of 46 positions shown; findings below may reference images not displayed]

FINDINGS: Lower chest: There is a stable 5 mm nodular opacity in the right
lower lobe, evident on axial slice 4 series 6. Lung bases otherwise
clear.

Hepatobiliary: No focal liver lesions are appreciable. Gallbladder
absent. No biliary duct dilatation.

Pancreas: No pancreatic mass or inflammatory focus.

Spleen: No splenic lesions are evident.

Adrenals/Urinary Tract: Adrenals bilaterally appear unremarkable. No
evident renal mass or hydronephrosis on either side. No renal or
ureteral calculus on either side. Urinary bladder midline with wall
thickness within normal limits.

Stomach/Bowel: Patient is status post gastric bypass procedure. No
wall thickening or fluid in postoperative regions. No appreciable
bowel obstruction. Terminal ileum region appears unremarkable. No
appendiceal inflammation. No free air or portal venous air. Note
that there is moderate stool in the rectum with mild enhancement of
the perirectal wall.

Vascular/Lymphatic: No abdominal aortic aneurysm. No arterial
vascular lesions are evident. Major venous structures are patent. No
evident adenopathy in the abdomen or pelvis.

Reproductive: Uterus is anteverted. No adnexal mass. A small amount
of free fluid is noted in the cul-de-sac region.

Other: No evident abscess in the abdomen or pelvis. No ascites
beyond the mild fluid in the cul-de-sac region noted above.

Musculoskeletal: No blastic or lytic bone lesions. No intramuscular
or abdominal wall lesions.
IMPRESSION: 1. Mild enhancement in the rectal wall region raising concern for a
degree of proctitis. No soft tissue stranding or significant wall
thickening in this area.

2. Slight fluid in the cul-de-sac may represent recent ovarian cyst
rupture or could be secondary to nearby proctitis.

3. No bowel obstruction. No abscess in the abdomen or pelvis.
Periappendiceal region appears normal. Evidence of previous gastric
bypass procedure without complicating features.

4. No renal or ureteral calculus. No hydronephrosis. Urinary bladder
wall thickness normal.
